# Patient Record
Sex: Female | Born: 1945 | Race: White | Hispanic: No | Marital: Married | Smoking: Former smoker
Health system: Southern US, Community
[De-identification: ages and names within clinical notes are randomized; demographics above are authoritative.]

## PROBLEM LIST (undated history)

## (undated) DIAGNOSIS — H903 Sensorineural hearing loss, bilateral: Secondary | ICD-10-CM

## (undated) DIAGNOSIS — R7303 Prediabetes: Secondary | ICD-10-CM

## (undated) DIAGNOSIS — I10 Essential (primary) hypertension: Secondary | ICD-10-CM

## (undated) DIAGNOSIS — E119 Type 2 diabetes mellitus without complications: Secondary | ICD-10-CM

## (undated) DIAGNOSIS — E7800 Pure hypercholesterolemia, unspecified: Secondary | ICD-10-CM

## (undated) HISTORY — PX: BREAST BIOPSY: SHX20

## (undated) HISTORY — PX: ABDOMINAL HYSTERECTOMY: SHX81

---

## 2005-11-16 ENCOUNTER — Ambulatory Visit: Payer: Self-pay | Admitting: Internal Medicine

## 2006-07-15 ENCOUNTER — Ambulatory Visit: Payer: Self-pay

## 2007-07-25 ENCOUNTER — Ambulatory Visit: Payer: Self-pay

## 2007-08-20 ENCOUNTER — Ambulatory Visit: Payer: Self-pay | Admitting: Internal Medicine

## 2014-01-23 ENCOUNTER — Ambulatory Visit: Payer: Self-pay | Admitting: Nurse Practitioner

## 2014-12-28 ENCOUNTER — Encounter
Admission: EM | Disposition: A | Discharge status: Home / Self Care | Payer: Self-pay | Source: Home / Self Care | Attending: Emergency Medicine

## 2014-12-28 ENCOUNTER — Observation Stay
Admission: EM | Admit: 2014-12-28 | Discharge: 2014-12-29 | Disposition: A | Discharge status: Home / Self Care | Payer: Medicare Other | Attending: General Surgery | Admitting: General Surgery

## 2014-12-28 ENCOUNTER — Ambulatory Visit (INDEPENDENT_AMBULATORY_CARE_PROVIDER_SITE_OTHER)
Admission: EM | Admit: 2014-12-28 | Discharge: 2014-12-28 | Disposition: A | Discharge status: Other Acute Inpatient Hospital | Payer: Medicare Other | Source: Home / Self Care | Attending: Family Medicine | Admitting: Family Medicine

## 2014-12-28 ENCOUNTER — Observation Stay: Payer: Medicare Other | Admitting: Anesthesiology

## 2014-12-28 ENCOUNTER — Encounter: Payer: Self-pay | Admitting: Emergency Medicine

## 2014-12-28 ENCOUNTER — Emergency Department: Payer: Medicare Other

## 2014-12-28 DIAGNOSIS — Z79899 Other long term (current) drug therapy: Secondary | ICD-10-CM | POA: Diagnosis not present

## 2014-12-28 DIAGNOSIS — L0291 Cutaneous abscess, unspecified: Secondary | ICD-10-CM | POA: Diagnosis not present

## 2014-12-28 DIAGNOSIS — N8110 Cystocele, unspecified: Secondary | ICD-10-CM | POA: Diagnosis not present

## 2014-12-28 DIAGNOSIS — I10 Essential (primary) hypertension: Secondary | ICD-10-CM | POA: Insufficient documentation

## 2014-12-28 DIAGNOSIS — E7800 Pure hypercholesterolemia, unspecified: Secondary | ICD-10-CM | POA: Diagnosis not present

## 2014-12-28 DIAGNOSIS — Z90710 Acquired absence of both cervix and uterus: Secondary | ICD-10-CM | POA: Diagnosis not present

## 2014-12-28 DIAGNOSIS — R509 Fever, unspecified: Secondary | ICD-10-CM | POA: Insufficient documentation

## 2014-12-28 DIAGNOSIS — Z87891 Personal history of nicotine dependence: Secondary | ICD-10-CM | POA: Insufficient documentation

## 2014-12-28 DIAGNOSIS — Z7984 Long term (current) use of oral hypoglycemic drugs: Secondary | ICD-10-CM | POA: Diagnosis not present

## 2014-12-28 DIAGNOSIS — L0390 Cellulitis, unspecified: Secondary | ICD-10-CM | POA: Diagnosis not present

## 2014-12-28 DIAGNOSIS — Z791 Long term (current) use of non-steroidal anti-inflammatories (NSAID): Secondary | ICD-10-CM | POA: Insufficient documentation

## 2014-12-28 DIAGNOSIS — E119 Type 2 diabetes mellitus without complications: Secondary | ICD-10-CM | POA: Insufficient documentation

## 2014-12-28 DIAGNOSIS — L02211 Cutaneous abscess of abdominal wall: Principal | ICD-10-CM | POA: Insufficient documentation

## 2014-12-28 HISTORY — PX: INCISION AND DRAINAGE ABSCESS: SHX5864

## 2014-12-28 HISTORY — DX: Pure hypercholesterolemia, unspecified: E78.00

## 2014-12-28 HISTORY — DX: Essential (primary) hypertension: I10

## 2014-12-28 HISTORY — DX: Type 2 diabetes mellitus without complications: E11.9

## 2014-12-28 LAB — CBC WITH DIFFERENTIAL/PLATELET
BASOS ABS: 0.1 10*3/uL (ref 0–0.1)
BASOS PCT: 1 %
Eosinophils Absolute: 0.2 10*3/uL (ref 0–0.7)
Eosinophils Relative: 1 %
HEMATOCRIT: 36.9 % (ref 35.0–47.0)
Hemoglobin: 12.5 g/dL (ref 12.0–16.0)
LYMPHS PCT: 12 %
Lymphs Abs: 2.2 10*3/uL (ref 1.0–3.6)
MCH: 29.4 pg (ref 26.0–34.0)
MCHC: 33.7 g/dL (ref 32.0–36.0)
MCV: 87.1 fL (ref 80.0–100.0)
Monocytes Absolute: 1.2 10*3/uL — ABNORMAL HIGH (ref 0.2–0.9)
Monocytes Relative: 7 %
NEUTROS ABS: 14.1 10*3/uL — AB (ref 1.4–6.5)
NEUTROS PCT: 79 %
Platelets: 242 10*3/uL (ref 150–440)
RBC: 4.24 MIL/uL (ref 3.80–5.20)
RDW: 12.6 % (ref 11.5–14.5)
WBC: 17.8 10*3/uL — AB (ref 3.6–11.0)

## 2014-12-28 LAB — URINALYSIS COMPLETE WITH MICROSCOPIC (ARMC ONLY)
BACTERIA UA: NONE SEEN
Bilirubin Urine: NEGATIVE
Glucose, UA: NEGATIVE mg/dL
Hgb urine dipstick: NEGATIVE
KETONES UR: NEGATIVE mg/dL
Nitrite: NEGATIVE
PH: 7 (ref 5.0–8.0)
PROTEIN: NEGATIVE mg/dL
RBC / HPF: NONE SEEN RBC/hpf (ref 0–5)
Specific Gravity, Urine: 1.02 (ref 1.005–1.030)

## 2014-12-28 LAB — COMPREHENSIVE METABOLIC PANEL
ALBUMIN: 3.6 g/dL (ref 3.5–5.0)
ALT: 20 U/L (ref 14–54)
AST: 24 U/L (ref 15–41)
Alkaline Phosphatase: 79 U/L (ref 38–126)
Anion gap: 8 (ref 5–15)
BILIRUBIN TOTAL: 0.7 mg/dL (ref 0.3–1.2)
BUN: 11 mg/dL (ref 6–20)
CHLORIDE: 100 mmol/L — AB (ref 101–111)
CO2: 29 mmol/L (ref 22–32)
CREATININE: 0.63 mg/dL (ref 0.44–1.00)
Calcium: 8.7 mg/dL — ABNORMAL LOW (ref 8.9–10.3)
GFR calc Af Amer: >60 mL/min (ref >60)
GLUCOSE: 124 mg/dL — AB (ref 65–99)
Potassium: 3.4 mmol/L — ABNORMAL LOW (ref 3.5–5.1)
Sodium: 137 mmol/L (ref 135–145)
TOTAL PROTEIN: 8.1 g/dL (ref 6.5–8.1)

## 2014-12-28 LAB — GLUCOSE, CAPILLARY: Glucose-Capillary: 92 mg/dL (ref 65–99)

## 2014-12-28 LAB — LIPASE, BLOOD: LIPASE: 27 U/L (ref 11–51)

## 2014-12-28 SURGERY — INCISION AND DRAINAGE, ABSCESS
Anesthesia: Choice | Site: Abdomen | Wound class: Dirty or Infected

## 2014-12-28 MED ORDER — LACTATED RINGERS IV SOLN
INTRAVENOUS | Status: DC | PRN
  Administered 2014-12-28: 22:00:00 via INTRAVENOUS

## 2014-12-28 MED ORDER — CEFTRIAXONE SODIUM 1 G IJ SOLR
1.0000 g | INTRAMUSCULAR | Status: AC
  Administered 2014-12-28: 1 g via INTRAVENOUS
  Filled 2014-12-28: qty 10

## 2014-12-28 MED ORDER — EPHEDRINE SULFATE 50 MG/ML IJ SOLN
INTRAMUSCULAR | Status: DC | PRN
  Administered 2014-12-28: 10 mg via INTRAVENOUS

## 2014-12-28 MED ORDER — IOHEXOL 240 MG/ML SOLN
25.0000 mL | Freq: Once | INTRAMUSCULAR | Status: AC | PRN
  Administered 2014-12-28: 25 mL via ORAL

## 2014-12-28 MED ORDER — DIPHENHYDRAMINE HCL 12.5 MG/5ML PO ELIX: 12.5000 mg | ORAL_SOLUTION | Freq: Four times a day (QID) | ORAL | Status: DC | PRN

## 2014-12-28 MED ORDER — LIDOCAINE HCL (PF) 1 % IJ SOLN
INTRAMUSCULAR | Status: AC
  Filled 2014-12-28: qty 30

## 2014-12-28 MED ORDER — FENTANYL CITRATE (PF) 100 MCG/2ML IJ SOLN: 25.0000 ug | INTRAMUSCULAR | Status: DC | PRN

## 2014-12-28 MED ORDER — MORPHINE SULFATE (PF) 4 MG/ML IV SOLN: 4.0000 mg | INTRAVENOUS | Status: DC | PRN

## 2014-12-28 MED ORDER — BUPIVACAINE HCL (PF) 0.5 % IJ SOLN
INTRAMUSCULAR | Status: AC
  Filled 2014-12-28: qty 30

## 2014-12-28 MED ORDER — HYDROCODONE-ACETAMINOPHEN 5-325 MG PO TABS: 1.0000 | ORAL_TABLET | ORAL | Status: DC | PRN

## 2014-12-28 MED ORDER — METFORMIN HCL 500 MG PO TABS
500.0000 mg | ORAL_TABLET | Freq: Every day | ORAL | Status: DC
  Administered 2014-12-29: 500 mg via ORAL
  Filled 2014-12-28: qty 1

## 2014-12-28 MED ORDER — ONDANSETRON HCL 4 MG/2ML IJ SOLN: 4.0000 mg | Freq: Four times a day (QID) | INTRAMUSCULAR | Status: DC | PRN

## 2014-12-28 MED ORDER — HYDROCHLOROTHIAZIDE 25 MG PO TABS
25.0000 mg | ORAL_TABLET | Freq: Every day | ORAL | Status: DC
  Administered 2014-12-29: 25 mg via ORAL
  Filled 2014-12-28: qty 1

## 2014-12-28 MED ORDER — ROCURONIUM BROMIDE 100 MG/10ML IV SOLN
INTRAVENOUS | Status: DC | PRN
  Administered 2014-12-28: 5 mg via INTRAVENOUS

## 2014-12-28 MED ORDER — SODIUM CHLORIDE 0.9 % IV BOLUS (SEPSIS)
500.0000 mL | INTRAVENOUS | Status: AC
  Administered 2014-12-28: 500 mL via INTRAVENOUS

## 2014-12-28 MED ORDER — DIPHENHYDRAMINE HCL 50 MG/ML IJ SOLN: 12.5000 mg | Freq: Four times a day (QID) | INTRAMUSCULAR | Status: DC | PRN

## 2014-12-28 MED ORDER — ONDANSETRON HCL 4 MG/2ML IJ SOLN: 4.0000 mg | Freq: Once | INTRAMUSCULAR | Status: DC | PRN

## 2014-12-28 MED ORDER — METOCLOPRAMIDE HCL 5 MG/ML IJ SOLN
INTRAMUSCULAR | Status: DC | PRN
  Administered 2014-12-28: 10 mg via INTRAVENOUS

## 2014-12-28 MED ORDER — ONDANSETRON HCL 4 MG/2ML IJ SOLN
INTRAMUSCULAR | Status: DC | PRN
  Administered 2014-12-28: 4 mg via INTRAVENOUS

## 2014-12-28 MED ORDER — FENTANYL CITRATE (PF) 100 MCG/2ML IJ SOLN
INTRAMUSCULAR | Status: DC | PRN
  Administered 2014-12-28 (×2): 50 ug via INTRAVENOUS

## 2014-12-28 MED ORDER — LACTATED RINGERS IV SOLN
INTRAVENOUS | Status: DC
  Administered 2014-12-29 (×2): via INTRAVENOUS

## 2014-12-28 MED ORDER — SUCCINYLCHOLINE CHLORIDE 20 MG/ML IJ SOLN
INTRAMUSCULAR | Status: DC | PRN
  Administered 2014-12-28: 120 mg via INTRAVENOUS

## 2014-12-28 MED ORDER — MIDAZOLAM HCL 2 MG/2ML IJ SOLN
INTRAMUSCULAR | Status: DC | PRN
  Administered 2014-12-28: 2 mg via INTRAVENOUS

## 2014-12-28 MED ORDER — BUPIVACAINE HCL (PF) 0.5 % IJ SOLN
INTRAMUSCULAR | Status: DC | PRN
  Administered 2014-12-28: 20 mL

## 2014-12-28 MED ORDER — IOHEXOL 300 MG/ML  SOLN
100.0000 mL | Freq: Once | INTRAMUSCULAR | Status: AC | PRN
  Administered 2014-12-28: 100 mL via INTRAVENOUS

## 2014-12-28 MED ORDER — ONDANSETRON 4 MG PO TBDP: 4.0000 mg | ORAL_TABLET | Freq: Four times a day (QID) | ORAL | Status: DC | PRN

## 2014-12-28 MED ORDER — HYDRALAZINE HCL 20 MG/ML IJ SOLN
10.0000 mg | INTRAMUSCULAR | Status: DC | PRN
Start: 2014-12-28 — End: 2014-12-29

## 2014-12-28 MED ORDER — PROPOFOL 10 MG/ML IV BOLUS
INTRAVENOUS | Status: DC | PRN
  Administered 2014-12-28: 150 mg via INTRAVENOUS

## 2014-12-28 MED ORDER — SULFAMETHOXAZOLE-TRIMETHOPRIM 800-160 MG PO TABS
2.0000 | ORAL_TABLET | Freq: Two times a day (BID) | ORAL | Status: DC
Start: 2014-12-28 — End: 2014-12-29
  Administered 2014-12-29 (×2): 2 via ORAL
  Filled 2014-12-28 (×2): qty 2

## 2014-12-28 MED ORDER — LIDOCAINE HCL (CARDIAC) 20 MG/ML IV SOLN
INTRAVENOUS | Status: DC | PRN
  Administered 2014-12-28: 30 mg via INTRAVENOUS

## 2014-12-28 SURGICAL SUPPLY — 31 items
CANISTER SUCT 1200ML W/VALVE (MISCELLANEOUS) ×3 IMPLANT
CATH TRAY 16F METER LATEX (MISCELLANEOUS) IMPLANT
CHLORAPREP W/TINT 26ML (MISCELLANEOUS) ×3 IMPLANT
DRAPE LAPAROTOMY 100X77 ABD (DRAPES) ×3 IMPLANT
DRAPE SHEET LG 3/4 BI-LAMINATE (DRAPES) ×3 IMPLANT
DRAPE UTILITY 15X26 TOWEL STRL (DRAPES) IMPLANT
GAUZE SPONGE 4X4 12PLY STRL (GAUZE/BANDAGES/DRESSINGS) ×3 IMPLANT
GLOVE BIO SURGEON STRL SZ7.5 (GLOVE) ×3 IMPLANT
GOWN STRL REUS W/ TWL LRG LVL3 (GOWN DISPOSABLE) ×2 IMPLANT
GOWN STRL REUS W/TWL LRG LVL3 (GOWN DISPOSABLE) ×4
KIT RM TURNOVER STRD PROC AR (KITS) ×3 IMPLANT
LABEL OR SOLS (LABEL) ×3 IMPLANT
NS IRRIG 1000ML POUR BTL (IV SOLUTION) ×3 IMPLANT
PACK BASIN MAJOR ARMC (MISCELLANEOUS) ×3 IMPLANT
PAD GROUND ADULT SPLIT (MISCELLANEOUS) ×3 IMPLANT
SET YANKAUER POOLE SUCT (MISCELLANEOUS) ×3 IMPLANT
SPONGE LAP 18X18 5 PK (GAUZE/BANDAGES/DRESSINGS) ×3 IMPLANT
STAPLER SKIN PROX 35W (STAPLE) IMPLANT
SUT PROLENE 0 CT 1 30 (SUTURE) IMPLANT
SUT SILK 2 0 (SUTURE)
SUT SILK 2-0 30XBRD TIE 12 (SUTURE) IMPLANT
SUT SILK 3-0 (SUTURE) ×3 IMPLANT
SUT VIC AB 2-0 BRD 54 (SUTURE) ×3 IMPLANT
SUT VIC AB 2-0 CT1 27 (SUTURE)
SUT VIC AB 2-0 CT1 TAPERPNT 27 (SUTURE) IMPLANT
SUT VIC AB 3-0 54X BRD REEL (SUTURE) IMPLANT
SUT VIC AB 3-0 BRD 54 (SUTURE)
SUT VIC AB 3-0 SH 27 (SUTURE)
SUT VIC AB 3-0 SH 27X BRD (SUTURE) IMPLANT
SYR BULB IRRIG 60ML STRL (SYRINGE) IMPLANT
WND VAC CANISTER 500ML (MISCELLANEOUS) IMPLANT

## 2014-12-28 NOTE — ED Notes (Signed)
Pt has had subjective fever and suprapubic/lower abdominal pain X 7 days that has been on and off.  Went to urgent care and they sent here for infection.

## 2014-12-28 NOTE — H&P (Signed)
Patient ID: Evelyn Kirby, female   DOB: 07-Nov-1945, 69 y.o.   MRN: [id number]  CC: ABSCESS  HPI Evelyn Pottersorma Johnson Westwood is a 69 y.o. female presents emergency department after being seen in urgent care secondary to a lower anterior abdominal infection. Patient states that over the last week she's had a gradual increase in discomfort followed by redness to the area. She's not noticed any drainage. The area of infection is near a prior hysterectomy incision and is very tender to touch. She's had some subjective fevers today which caused her to seek care. She denies any nausea, vomiting, diarrhea, constipation, chest pain, shortness of breath. Prior to this she was in her usual state with her chronic medical problems.  HPI  Past Medical History  Diagnosis Date  . Hypertension   . Hypercholesteremia   . Diabetes mellitus without complication [facility](HCC)     Past Surgical History  Procedure Laterality Date  . Abdominal hysterectomy      Family History  Problem Relation Age of Onset  . Heart failure Mother     Social History Social History  Substance Use Topics  . Smoking status: Former Games developermoker  . Smokeless tobacco: None  . Alcohol Use: No    No Known Allergies  No current facility-administered medications for this encounter.   Current Outpatient Prescriptions  Medication Sig Dispense Refill  . hydrochlorothiazide (HYDRODIURIL) 25 MG tablet Take 1 tablet by mouth daily.    . metFORMIN (GLUCOPHAGE) 500 MG tablet Take 1 tablet by mouth daily.    . naproxen sodium (RA NAPROXEN SODIUM) 220 MG tablet Take 2 tablets by mouth daily.    . simvastatin (ZOCOR) 40 MG tablet Take 1 tablet by mouth at bedtime.       Review of Systems A multi-point review of systems was asked and was negative except for the findings documented in the history of present illness  Physical Exam Blood pressure 144/72, pulse 90, temperature 98.7 F (37.1 C), temperature source Oral, resp. rate 16, height 5'  1.5" (1.562 m), weight 61.236 kg (135 lb), SpO2 97 %. CONSTITUTIONAL: No acute distress. EYES: Pupils are equal, round, and reactive to light, Sclera are non-icteric. EARS, NOSE, MOUTH AND THROAT: The oropharynx is clear. The oral mucosa is pink and moist. Hearing is intact to voice. LYMPH NODES:  Lymph nodes in the neck are normal. RESPIRATORY:  Lungs are clear. There is normal respiratory effort, with equal breath sounds bilaterally, and without pathologic use of accessory muscles. CARDIOVASCULAR: Heart is regular without murmurs, gallops, or rubs. GI: The abdomen is soft, tender to palpation in the lower midline at the area of redness, and nondistended. In her lower midline there is a visible erythema and cellulitis coming from her lower abdominal fold, there is a palpable area of induration that is very tender consistent with abscess There are no palpable masses. There is no hepatosplenomegaly. There are normal bowel sounds in all quadrants. GU: Rectal deferred.   MUSCULOSKELETAL: Normal muscle strength and tone. No cyanosis or edema.   SKIN: Turgor is good and there are no pathologic skin lesions or ulcers. NEUROLOGIC: Motor and sensation is grossly normal. Cranial nerves are grossly intact. PSYCH:  Oriented to person, place and time. Affect is normal.  Data Reviewed Labs concerning for leukocytosis of 17.8. CT shows an abscess that correlates with the palpable area of induration. It is approximately 5 cm in greatest diameter and is all superficial to the fascia. There is no intra-abdominal pathology visualized that correlates  with her infection. I have personally reviewed the patient's imaging, laboratory findings and medical records.    Assessment    Abdominal wall abscess    Plan    Discussed with the patient and her family the diagnosis of her abdominal wall abscess. Discussed with patient that her diabetes makes her prone to possibility of more competent infectious. Due to the size  and location of this abscess discussed a trip to the operating room to drain her infection. The procedure was described in detail to include the risks, benefits, alternatives. Primary risks being pain, bleeding, infection and possible need for more drainage. Patient voiced understanding and we will proceed urgently to the operating room for incision and drainage of her abdominal wall abscess. She'll be right at the hospital under observation afterwards.     Time spent with the patient was 45 minutes, with more than 50% of the time spent in face-to-face education, counseling and care coordination.     Ricarda Frame, MD FACS General Surgeon 12/28/2014, 9:21 PM

## 2014-12-28 NOTE — ED Notes (Signed)
Dr. Allena KatzPatel called report to charge nurse at [facility] ED.

## 2014-12-28 NOTE — ED Notes (Signed)
Pt presents with lower abdominal tenderness and redness/heat to skin.

## 2014-12-28 NOTE — Anesthesia Postprocedure Evaluation (Signed)
Anesthesia Post Note  Patient: Evelyn Kirby  Procedure(s) Performed: Procedure(s) (LRB): INCISION AND DRAINAGE ABSCESS (N/A)  Patient location during evaluation: PACU Anesthesia Type: General Level of consciousness: awake and alert and oriented Pain management: pain level controlled Vital Signs Assessment: post-procedure vital signs reviewed and stable Respiratory status: spontaneous breathing Cardiovascular status: blood pressure returned to baseline Postop Assessment: no headache Anesthetic complications: no    Last Vitals:  Filed Vitals:   12/28/14 2253 12/28/14 2320  BP:    Pulse:    Temp: 37.8 C 37.4 C  Resp: 21     Last Pain:  Filed Vitals:   12/28/14 2324  PainSc: 0-No pain                 Aspen Lawrance

## 2014-12-28 NOTE — Op Note (Signed)
  12/28/2014  10:53 PM  PATIENT:  Gavin PottersNorma Johnson Millikin  69 y.o. female  PRE-OPERATIVE DIAGNOSIS:  Abdominal wall abscess  POST-OPERATIVE DIAGNOSIS:  Abdominal wall abscess  PROCEDURE:  Incision and drainage of abdominal wall abscess  SURGEON:  Surgeon(s) and Role:    * Ricarda Frameharles Dewitte Vannice, MD - Primary  ASSISTANTS: None  ANESTHESIA: Gen.  INDICATIONS FOR PROCEDURE worsening abdominal wall abscess  DICTATION: Patient was identified in the emergency department is having an anterior abdominal wall abscess below her umbilicus but above her prior Pfannenstiel incision. She confirmed the need for incision and drainage of this abdominal wall abscess and informed consent was confirmed. Patient and family voiced understanding of the risks of drainage and wished to proceed.  Procedure in detail, patient taken to the operating room placed in supine position where general endotracheal anesthesia was obtained. After an uneventful induction and intubation patient was prepped and draped in standard sterile fashion. We then performed timeout that correctly identified the patient and procedure and safety precautions. We then proceeded with an incision and drainage of abdominal wall abscess.  A linear incision was made over the greatest area of induration. Immediate return of grossly purulent fluid was obtained. This fluid was then cultured. Dissection was then used to remove the remainder of the freely flowing fluid. Scissors were used to make the lower opening wider and a Kelly forceps was used to bluntly opened up the abscess cavity. The entire cavity was then copiously irrigated with saline irrigation. There is no continued purulent drainage after irrigation. Of note, a free-floating Maxon suture was found in the middle the abscess.  Due to the size of the abscess cavity decision was made to place a Penrose drain with an. Quarter-inch Penrose drain was placed into the abscess cavity and secured with a 3-0  nylon suture. The lateral edge of the incision was also reapproximated with 3-0 nylon suture. Entire area was localized with a 0.5% Marcaine plain. A sterile dressing of plain gauze and ABDs pad was placed over this and secured with tape.  All counts were correct at the end of the procedure. There were no immediate complications. Patient was awoken from general endotracheal anesthesia and transferred to the PACU in good condition.  Findings: Large abdominal wall abscess  Complications: None  Estimated Blood loss: 5 miles  Drains: Reesa ChewQuarter-inch Penrose   Haidynn Almendarez, MD  12/28/2014

## 2014-12-28 NOTE — ED Notes (Signed)
States started last week with rash and itching around surgical scar from hysterectomy 30+ years ago. Now occasionally painful, red. States "has had same before and needed oral antibiotics"

## 2014-12-28 NOTE — ED Notes (Signed)
Pt reports has had infection before in stomach and it burst.  Unable to name infection or what dx was.

## 2014-12-28 NOTE — ED Notes (Signed)
MD at bedside. 

## 2014-12-28 NOTE — Brief Op Note (Signed)
12/28/2014  10:51 PM  PATIENT:  Gavin PottersNorma Johnson Delancey  69 y.o. female  PRE-OPERATIVE DIAGNOSIS:  abdominal abscess  POST-OPERATIVE DIAGNOSIS:  abdominal wall abscess  PROCEDURE:  Procedure(s): INCISION AND DRAINAGE ABSCESS (N/A)  SURGEON:  Surgeon(s) and Role:    * Ricarda Frameharles Karesa Maultsby, MD - Primary  PHYSICIAN ASSISTANT:   ASSISTANTS: none   ANESTHESIA:   general  EBL:     BLOOD ADMINISTERED:none  DRAINS: Penrose drain in the abscess cavity   LOCAL MEDICATIONS USED:  MARCAINE     SPECIMEN:  Source of Specimen:  culture of drainage  DISPOSITION OF SPECIMEN:  PATHOLOGY  COUNTS:  YES  TOURNIQUET:  * No tourniquets in log *  DICTATION: .Dragon Dictation  PLAN OF CARE: Admit to inpatient   PATIENT DISPOSITION:  PACU - hemodynamically stable.   Delay start of Pharmacological VTE agent (>24hrs) due to surgical blood loss or risk of bleeding: no

## 2014-12-28 NOTE — Anesthesia Preprocedure Evaluation (Signed)
Anesthesia Evaluation  Patient identified by MRN, date of birth, ID band Patient awake    Reviewed: Allergy & Precautions, NPO status , Patient's Chart, lab work & pertinent test results  Airway Mallampati: III  TM Distance: <3 FB Neck ROM: Full    Dental  (+) Caps Front upper bridge:   Pulmonary former smoker,    Pulmonary exam normal breath sounds clear to auscultation       Cardiovascular hypertension, Pt. on medications Normal cardiovascular exam     Neuro/Psych negative neurological ROS  negative psych ROS   GI/Hepatic Neg liver ROS, Abdominal wall abcess   Endo/Other  diabetes, Well Controlled, Type 2, Oral Hypoglycemic Agents  Renal/GU negative Renal ROS  negative genitourinary   Musculoskeletal negative musculoskeletal ROS (+)   Abdominal (+)  Abdomen: tender.    Peds negative pediatric ROS (+)  Hematology   Anesthesia Other Findings   Reproductive/Obstetrics negative OB ROS                             Anesthesia Physical Anesthesia Plan  ASA: II and emergent  Anesthesia Plan: General   Post-op Pain Management:    Induction: Intravenous, Cricoid pressure planned and Rapid sequence  Airway Management Planned: Oral ETT  Additional Equipment:   Intra-op Plan:   Post-operative Plan: Extubation in OR  Informed Consent: I have reviewed the patients History and Physical, chart, labs and discussed the procedure including the risks, benefits and alternatives for the proposed anesthesia with the patient or authorized representative who has indicated his/her understanding and acceptance.   Dental advisory given  Plan Discussed with: CRNA and Surgeon  Anesthesia Plan Comments:         Anesthesia Quick Evaluation

## 2014-12-28 NOTE — Transfer of Care (Signed)
Immediate Anesthesia Transfer of Care Note  Patient: Evelyn Kirby  Procedure(s) Performed: Procedure(s): INCISION AND DRAINAGE ABSCESS (N/A)  Patient Location: PACU  Anesthesia Type:General  Level of Consciousness: awake, alert  and oriented  Airway & Oxygen Therapy: Patient Spontanous Breathing and Patient connected to face mask oxygen  Post-op Assessment: Report given to RN and Post -op Vital signs reviewed and stable  Post vital signs: Reviewed and stable  Last Vitals:  Filed Vitals:   12/28/14 2100 12/28/14 2138  BP:  158/63  Pulse: 90 99  Temp:    Resp:      Complications: No apparent anesthesia complications

## 2014-12-28 NOTE — ED Provider Notes (Signed)
[facility] Emergency Department Provider Note  ____________________________________________  Time seen: Approximately 7:12 PM  I have reviewed the triage vital signs and the nursing notes.   HISTORY  Chief Complaint Abdominal Pain    HPI Evelyn Kirby is a 69 y.o. female with a history of hypertension, hypercholesterolemia, diabetes, and a abdominal hysterectomy about 30 years ago who presents with approximately one week of gradual onset of lower abdominal pain, redness near the site of her prior hysterectomy incision, and swelling and a firmness to palpation in the area.  She went to the urgent care today and was sent to the emergency department for further evaluation.She describes her pain as moderate although she is in no acute distress at this time.  She has had some subjective fevers and chills.  She denies chest pain, shortness of breath, nausea/vomiting, dysuria.   Past Medical History  Diagnosis Date  . Hypertension   . Hypercholesteremia   . Diabetes mellitus without complication [facility](HCC)     Patient Active Problem List   Diagnosis Date Noted  . Abscess 12/28/2014    Past Surgical History  Procedure Laterality Date  . Abdominal hysterectomy      No current outpatient prescriptions on file.  Allergies Review of patient's allergies indicates no known allergies.  Family History  Problem Relation Age of Onset  . Heart failure Mother     Social History Social History  Substance Use Topics  . Smoking status: Former Games developermoker  . Smokeless tobacco: None  . Alcohol Use: No    Review of Systems Constitutional: No fever/chills Eyes: No visual changes. ENT: No sore throat. Cardiovascular: Denies chest pain. Respiratory: Denies shortness of breath. Gastrointestinal: No abdominal pain.  No nausea, no vomiting.  No diarrhea.  No constipation. Genitourinary: Negative for dysuria. Musculoskeletal: Negative for back pain. Skin: lower  abdominal / pelvic redness Neurological: Negative for headaches, focal weakness or numbness.  10-point ROS otherwise negative.  ____________________________________________   PHYSICAL EXAM:  VITAL SIGNS: ED Triage Vitals  Enc Vitals Group     BP 12/28/14 1835 175/72 mmHg     Pulse Rate 12/28/14 1835 97     Resp 12/28/14 1835 16     Temp 12/28/14 1835 98.7 F (37.1 C)     Temp Source 12/28/14 1835 Oral     SpO2 12/28/14 1835 97 %     Weight 12/28/14 1835 135 lb (61.236 kg)     Height 12/28/14 1835 5' 1.5" (1.562 m)     Head Cir --      Peak Flow --      Pain Score 12/28/14 1836 2     Pain Loc --      Pain Edu? --      Excl. in GC? --     Constitutional: Alert and oriented. Well appearing and in no acute distress. Eyes: Conjunctivae are normal. PERRL. EOMI. Head: Atraumatic. Nose: No congestion/rhinnorhea. Mouth/Throat: Mucous membranes are moist.  Oropharynx non-erythematous. Neck: No stridor.   Cardiovascular: Normal rate, regular rhythm. Grossly normal heart sounds.  Good peripheral circulation. Respiratory: Normal respiratory effort.  No retractions. Lungs CTAB. Gastrointestinal: Abdomen in general is nontender to palpation and soft.  She has a large area of erythema consistent with cellulitis covering her anterior pannus and surrounding the hysterectomy scar.  In addition, there is a large area of induration surrounding the scar which the patient says is new and it is tender to palpation.. Musculoskeletal: No lower extremity tenderness nor edema.  No joint effusions. Neurologic:  Normal speech and language. No gross focal neurologic deficits are appreciated.  Skin:  Skin is warm, dry and intact. No rash noted. Psychiatric: Mood and affect are normal. Speech and behavior are normal.  ____________________________________________   LABS (all labs ordered are listed, but only abnormal results are displayed)  Labs Reviewed  CBC WITH DIFFERENTIAL/PLATELET - Abnormal;  Notable for the following:    WBC 17.8 (*)    Neutro Abs 14.1 (*)    Monocytes Absolute 1.2 (*)    All other components within normal limits  COMPREHENSIVE METABOLIC PANEL - Abnormal; Notable for the following:    Potassium 3.4 (*)    Chloride 100 (*)    Glucose, Bld 124 (*)    Calcium 8.7 (*)    All other components within normal limits  URINALYSIS COMPLETEWITH MICROSCOPIC ([facility] ONLY) - Abnormal; Notable for the following:    Color, Urine COLORLESS (*)    APPearance CLEAR (*)    Leukocytes, UA 2+ (*)    Squamous Epithelial / LPF 0-5 (*)    All other components within normal limits  CULTURE, BLOOD (ROUTINE X 2)  CULTURE, BLOOD (ROUTINE X 2)  LIPASE, BLOOD   ____________________________________________  EKG  Not indicated ____________________________________________  RADIOLOGY   Ct Abdomen Pelvis W Contrast  12/28/2014  CLINICAL DATA:  Patient presents today with symptoms of a red, warm, tender area along her surgical scar from her hysterectomy over 30 years ago. Patient states that she has noticed the area over the last 3 days getting more tender and larger. EXAM: CT ABDOMEN AND PELVIS WITH CONTRAST TECHNIQUE: Multidetector CT imaging of the abdomen and pelvis was performed using the standard protocol following bolus administration of intravenous contrast. CONTRAST:  OMNIPAQUE IOHEXOL 300 MG/ML  SOLN COMPARISON:  None available FINDINGS: Minimal dependent atelectasis posteriorly in the visualized lung bases. Unremarkable liver, nondilated gallbladder, spleen, adrenal glands, pancreas. 4.3 cm near fluid attenuation partially exophytic lesion from the upper pole right kidney, with some in tunnel enhancing septations in its inferior aspect. Kidneys otherwise unremarkable. No hydronephrosis. Stomach, small bowel, colon nondilated. Normal appendix. Portal vein patent. Urinary bladder physiologically distended. Large Cystocele. Complex process in the left anterior abdominal wall  inferior to the umbilicus with a peripherally enhancing somewhat irregular fluid collection in the deep subcutaneous tissues adjacent to the abdominal wall fascia , tracking superficially to the skin, measuring 5 cm anterior posterior x 3.4 cm craniocaudal x 3.5 cm. There are moderate inflammatory/ edematous changes around this process and extending laterally in the deep subcutaneous tissues of the body wall on the left. No intraperitoneal extension of the process is evident. No ascites. No free air. No adenopathy. 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 IMPRESSION: 1. 5 cm subcutaneous abscess in the low anterior body wall, with a dominant deep component tracking superficially to the skin surface. 2. 4.3 cm Bosniak category 2 F right upper pole renal cystic lesion. Recommend elective outpatient renal MR with contrast for further characterization. This recommendation follows ACR consensus guidelines: Managing Incidental Findings on Abdominal CT: White Paper of the ACR Incidental Findings Committee. J Am Coll Radiol 2010;7:754-773 . 3. Cystocele 4. 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 Electronically Signed   By: Corlis Leak M.D.   On: 12/28/2014 20:04    ____________________________________________   PROCEDURES  Procedure(s) performed: None  Critical Care performed: No ____________________________________________   INITIAL IMPRESSION / ASSESSMENT AND PLAN / ED COURSE  Pertinent labs &  imaging results that were available during my care of the patient were reviewed by me and considered in my medical decision making (see chart for details).  The patient has a leukocytosis of nearly 18 and has a large area of induration and what appears to be cellulitis of her lower abdomen.  However she is afebrile and has borderline tachycardia.  I will obtain a CT scan to evaluate for possible intra-abdominal abscess as well as giving a dose of empiric antibiotics given the  leukocytosis and probability that this is at least a cutaneous infection.  The patient is very stable at this time and understands and agrees with the plan.   (Note that documentation was delayed due to multiple ED patients requiring immediate care.)   The patient has a large abscess in the abdominal wall.  Given how deep it is in the size of it, I contacted the surgeon, Dr. Tonita Cong, who came to the emergency department and personally evaluated the patient as well as discussed it with me.  He is admitting her to take her to the operating room for drainage.  He agreed with my selection of ceftriaxone 1 g IV empiric antibiotics.  ____________________________________________  FINAL CLINICAL IMPRESSION(S) / ED DIAGNOSES  Final diagnoses:  Cutaneous abscess of abdominal wall      NEW MEDICATIONS STARTED DURING THIS VISIT:  Current Discharge Medication List       Loleta Rose, MD 12/28/14 2219

## 2014-12-28 NOTE — ED Notes (Signed)
Pts jewelery (watch, earrings, 3 rings) and put in bag w/ pt name.  Pts belongings given to husband, pt informed of this.

## 2014-12-28 NOTE — ED Provider Notes (Signed)
CSN: [id number]     Arrival date & time 12/28/14  1603 History   First MD Initiated Contact with Patient 12/28/14 1700     Chief Complaint  Patient presents with  . Rash   (Consider location/radiation/quality/duration/timing/severity/associated sxs/prior Treatment) HPI: Patient presents today with symptoms of a red, warm, tender area along her surgical scar from her hysterectomy over 30 years ago. Patient states that she has noticed the area over the last 3 days getting more tender and larger. She has had a low-grade fever at home. She denies any nausea or vomiting or chest pain or shortness of breath. She denies any diarrhea or constipation or any urinary symptoms.  Past Medical History  Diagnosis Date  . Hypertension   . Hypercholesteremia   . Diabetes mellitus without complication [facility](HCC)    Past Surgical History  Procedure Laterality Date  . Abdominal hysterectomy     Family History  Problem Relation Age of Onset  . Heart failure Mother    Social History  Substance Use Topics  . Smoking status: Former Games developermoker  . Smokeless tobacco: None  . Alcohol Use: No   OB History    No data available     Review of Systems: Negative except mentioned above.   Allergies  Review of patient's allergies indicates no known allergies.  Home Medications   Prior to Admission medications   Not on File   Meds Ordered and Administered this Visit  Medications - No data to display  BP 169/60 mmHg  Pulse 92  Temp(Src) 99.2 F (37.3 C) (Tympanic)  Resp 16  Ht 5' 1.5" (1.562 m)  Wt 135 lb (61.236 kg)  BMI 25.10 kg/m2  SpO2 99% No data found.   Physical Exam   GENERAL: NAD, doesn't appear toxic RESP: CTA B CARD: RRR ABD: large area of erythema, warmth, and induration along pannus, no fluctuance, mild to moderate tenderness of the area, no guarding or rebound  NEURO: CN II-XII grossly intact  ED Course  Procedures    MDM   1. Cellulitis and abscess   Given the location and  size of the area I would recommend that the patient go to the ER at this time to have possibly a CT done of the abdomen and pelvis and also IV/IM antibiotics. Patient may need admission. This was explained to the patient. Patient states that [facility]. I discussed the case with Judeth CornfieldStephanie at the ER who states she will tell the charge nurse who will be watching for the patient to come and be evaluated and treated promptly.    Jolene ProvostKirtida Ryver Zadrozny, MD 12/28/14 [phone]

## 2014-12-28 NOTE — Anesthesia Procedure Notes (Signed)
Procedure Name: Intubation Date/Time: 12/28/2014 10:12 PM Performed by: Tonia GhentOOK-MARTIN, Crissie Aloi Pre-anesthesia Checklist: Patient identified, Emergency Drugs available, Suction available, Patient being monitored and Timeout performed Patient Re-evaluated:Patient Re-evaluated prior to inductionOxygen Delivery Method: Circle system utilized Preoxygenation: Pre-oxygenation with 100% oxygen Intubation Type: IV induction, Rapid sequence and Cricoid Pressure applied Ventilation: Mask ventilation without difficulty Laryngoscope Size: Miller and 3 Grade View: Grade II Tube type: Oral Tube size: 7.0 mm Number of attempts: 1 Airway Equipment and Method: Stylet

## 2014-12-29 LAB — CBC
HEMATOCRIT: 32.3 % — AB (ref 35.0–47.0)
HEMOGLOBIN: 10.8 g/dL — AB (ref 12.0–16.0)
MCH: 28.7 pg (ref 26.0–34.0)
MCHC: 33.4 g/dL (ref 32.0–36.0)
MCV: 86 fL (ref 80.0–100.0)
Platelets: 203 10*3/uL (ref 150–440)
RBC: 3.75 MIL/uL — AB (ref 3.80–5.20)
RDW: 13 % (ref 11.5–14.5)
WBC: 12 10*3/uL — ABNORMAL HIGH (ref 3.6–11.0)

## 2014-12-29 MED ORDER — SULFAMETHOXAZOLE-TRIMETHOPRIM 800-160 MG PO TABS: 2.0000 | ORAL_TABLET | Freq: Two times a day (BID) | ORAL | Status: DC

## 2014-12-29 MED ORDER — HYDROCODONE-ACETAMINOPHEN 5-325 MG PO TABS: 1.0000 | ORAL_TABLET | ORAL | Status: DC | PRN

## 2014-12-29 NOTE — Care Management Obs Status (Signed)
MEDICARE OBSERVATION STATUS NOTIFICATION   Patient Details  Name: Evelyn Kirby MRN: [id number] Date of Birth: 05/30/1945   Medicare Observation Status Notification Given:  No (in house less than 24 hours)    Caren MacadamMichelle Jasey Cortez, RN 12/29/2014, 4:46 PM

## 2014-12-29 NOTE — Final Progress Note (Signed)
Surgery  Dressing intact Pain improved Stable and improved for discharge

## 2014-12-29 NOTE — Discharge Instructions (Signed)
Incision and Drainage Incision and drainage is a procedure in which a sac-like structure (cystic structure) is opened and drained. The area to be drained usually contains material such as pus, fluid, or blood.  LET YOUR CAREGIVER KNOW ABOUT:   Allergies to medicine.  Medicines taken, including vitamins, herbs, eyedrops, over-the-counter medicines, and creams.  Use of steroids (by mouth or creams).  Previous problems with anesthetics or numbing medicines.  History of bleeding problems or blood clots.  Previous surgery.  Other health problems, including diabetes and kidney problems.  Possibility of pregnancy, if this applies. RISKS AND COMPLICATIONS  Pain.  Bleeding.  Scarring.  Infection. BEFORE THE PROCEDURE  You may need to have an ultrasound or other imaging tests to see how large or deep your cystic structure is. Blood tests may also be used to determine if you have an infection or how severe the infection is. You may need to have a tetanus shot. PROCEDURE  The affected area is cleaned with a cleaning fluid. The cyst area will then be numbed with a medicine (local anesthetic). A small incision will be made in the cystic structure. A syringe or catheter may be used to drain the contents of the cystic structure, or the contents may be squeezed out. The area will then be flushed with a cleansing solution. After cleansing the area, it is often gently packed with a gauze or another wound dressing. Once it is packed, it will be covered with gauze and tape or some other type of wound dressing. AFTER THE PROCEDURE   Often, you will be allowed to go home right after the procedure.  You may be given antibiotic medicine to prevent or heal an infection.  If the area was packed with gauze or some other wound dressing, you will likely need to come back in 1 to 2 days to get it removed.  The area should heal in about 14 days.   This information is not intended to replace advice given  to you by your health care provider. Make sure you discuss any questions you have with your health care provider.   Document Released: 06/17/2000 Document Revised: 06/23/2011 Document Reviewed: 02/16/2011 Elsevier Interactive Patient Education 2016 [employer] Inc.  

## 2014-12-29 NOTE — Progress Notes (Signed)
Pt stable. IV removed. D/c instructions given and education provided. Drsg supplies given. Signed prescriptions given. Pt states she understands d/c instructions. Pt dressed and escorted out and driven home by family.

## 2014-12-29 NOTE — Discharge Summary (Signed)
Patient ID: Evelyn Kirby MRN: [id number] DOB/AGE: 13-Mar-1945 69 y.o.  Admit date: 12/28/2014 Discharge date: 12/29/2014  Discharge Diagnoses:  Abdominal Wall Abscess   Procedures Performed: Incision and Drainage of Abdominal wall abscess  Discharged Condition: good  Hospital Course: Taken to OR from ED with a large, superficial abdominal wall abscess. Tolerated procedure well. Able to be discharged home on oral antibiotics POD#1.  Discharge Orders: Discharge home, keep incision clean and dry, DO NOT SUBMERGE for 10 days.  Disposition: 02-Short Term Hospital  Discharge Medications:  .  HYDROcodone-acetaminophen (NORCO/VICODIN) 5-325 MG per tablet 1-2 tablet, 1-2 tablet, Oral, Q4H PRN, Ricarda Frameharles Aubreyana Saltz, MD .  sulfamethoxazole-trimethoprim (BACTRIM DS,SEPTRA DS) 800-160 MG per tablet 2 tablet, 2 tablet, Oral, Q12H, Ricarda Frameharles Tamina Cyphers, MD, 2 tablet at 12/29/14 0013  Follwup: Follow-up Information    [facility] [place]. Schedule an appointment as soon as possible for a visit in 2 weeks.   Specialty:  General Surgery   Why:  for wound check and drain removal   Contact information:   [address] [place] [id number] [phone]      Signed: Ricarda FrameCharles Shalee Paolo 12/29/2014, 6:36 AM

## 2014-12-31 ENCOUNTER — Encounter: Payer: Self-pay | Admitting: General Surgery

## 2015-01-01 LAB — WOUND CULTURE

## 2015-01-02 LAB — CULTURE, BLOOD (ROUTINE X 2)
Culture: NO GROWTH
Culture: NO GROWTH

## 2015-01-08 DIAGNOSIS — I10 Essential (primary) hypertension: Secondary | ICD-10-CM | POA: Insufficient documentation

## 2015-01-08 DIAGNOSIS — M1990 Unspecified osteoarthritis, unspecified site: Secondary | ICD-10-CM | POA: Insufficient documentation

## 2015-01-08 DIAGNOSIS — L309 Dermatitis, unspecified: Secondary | ICD-10-CM | POA: Insufficient documentation

## 2015-01-08 DIAGNOSIS — E538 Deficiency of other specified B group vitamins: Secondary | ICD-10-CM | POA: Insufficient documentation

## 2015-01-08 DIAGNOSIS — E119 Type 2 diabetes mellitus without complications: Secondary | ICD-10-CM | POA: Insufficient documentation

## 2015-01-08 DIAGNOSIS — E785 Hyperlipidemia, unspecified: Secondary | ICD-10-CM | POA: Insufficient documentation

## 2015-01-08 DIAGNOSIS — F32A Depression, unspecified: Secondary | ICD-10-CM | POA: Insufficient documentation

## 2015-01-08 DIAGNOSIS — F329 Major depressive disorder, single episode, unspecified: Secondary | ICD-10-CM | POA: Insufficient documentation

## 2015-01-11 ENCOUNTER — Encounter: Payer: Self-pay | Admitting: Surgery

## 2015-01-11 ENCOUNTER — Ambulatory Visit (INDEPENDENT_AMBULATORY_CARE_PROVIDER_SITE_OTHER): Payer: Medicare Other | Admitting: Surgery

## 2015-01-11 VITALS — BP 171/75 | HR 93 | Temp 97.7°F | Ht 62.0 in | Wt 138.0 lb

## 2015-01-11 DIAGNOSIS — L0291 Cutaneous abscess, unspecified: Secondary | ICD-10-CM

## 2015-01-11 NOTE — Progress Notes (Signed)
[age]yr old female with abdominal abscess, s/p I &D with penrose drain.  She denies fever, chills, redness and some drainage.    Filed Vitals:   01/11/15 0831  BP: 171/75  Pulse: 93  Temp: 97.7 F (36.5 C)   PE:  Gen: NAD Abd: penrose removed, no erythema, some drainage   A/p:  Healing well, finish out Bactrim, 1 day left.  If fever, redness or pain come back contact us, otherwise is should resolve over the next week or two.

## 2015-01-11 NOTE — Patient Instructions (Signed)
We have taken your drain out today. This will continue to drain for the next few days. You will want to keep a dressing over this area until this draining has stopped.  Please call our office with any questions or concerns.  Please do not submerge in a tub, hot tub, or pool until incisions are completely sealed.  If you develop redness, drainage, or pain at drain site- call our office immediately and speak with a nurse.

## 2015-01-17 ENCOUNTER — Other Ambulatory Visit: Payer: Self-pay | Admitting: Nurse Practitioner

## 2015-01-17 DIAGNOSIS — Z1231 Encounter for screening mammogram for malignant neoplasm of breast: Secondary | ICD-10-CM

## 2015-01-25 ENCOUNTER — Ambulatory Visit
Admission: RE | Admit: 2015-01-25 | Discharge: 2015-01-25 | Disposition: A | Discharge status: Home / Self Care | Payer: Medicare Other | Source: Ambulatory Visit | Attending: Nurse Practitioner | Admitting: Nurse Practitioner

## 2015-01-25 ENCOUNTER — Ambulatory Visit: Payer: Medicare Other

## 2015-01-25 DIAGNOSIS — Z1231 Encounter for screening mammogram for malignant neoplasm of breast: Secondary | ICD-10-CM | POA: Diagnosis not present

## 2015-02-17 DIAGNOSIS — L02211 Cutaneous abscess of abdominal wall: Secondary | ICD-10-CM | POA: Insufficient documentation

## 2016-02-10 ENCOUNTER — Other Ambulatory Visit: Payer: Self-pay | Admitting: Nurse Practitioner

## 2016-02-10 DIAGNOSIS — Z1231 Encounter for screening mammogram for malignant neoplasm of breast: Secondary | ICD-10-CM

## 2016-02-12 ENCOUNTER — Other Ambulatory Visit: Payer: Self-pay | Admitting: Nurse Practitioner

## 2016-02-12 ENCOUNTER — Ambulatory Visit
Admission: RE | Admit: 2016-02-12 | Discharge: 2016-02-12 | Disposition: A | Discharge status: Home / Self Care | Payer: Medicare Other | Source: Ambulatory Visit | Attending: Nurse Practitioner | Admitting: Nurse Practitioner

## 2016-02-12 DIAGNOSIS — Z1231 Encounter for screening mammogram for malignant neoplasm of breast: Secondary | ICD-10-CM

## 2017-02-11 ENCOUNTER — Other Ambulatory Visit: Payer: Self-pay | Admitting: Nurse Practitioner

## 2017-02-11 DIAGNOSIS — Z1231 Encounter for screening mammogram for malignant neoplasm of breast: Secondary | ICD-10-CM

## 2017-02-16 ENCOUNTER — Ambulatory Visit
Admission: RE | Admit: 2017-02-16 | Discharge: 2017-02-16 | Disposition: A | Discharge status: Home / Self Care | Payer: Medicare Other | Source: Ambulatory Visit | Attending: Nurse Practitioner | Admitting: Nurse Practitioner

## 2017-02-16 DIAGNOSIS — Z1231 Encounter for screening mammogram for malignant neoplasm of breast: Secondary | ICD-10-CM | POA: Diagnosis not present

## 2018-02-22 ENCOUNTER — Other Ambulatory Visit: Payer: Self-pay | Admitting: Nurse Practitioner

## 2018-02-22 DIAGNOSIS — Z1231 Encounter for screening mammogram for malignant neoplasm of breast: Secondary | ICD-10-CM

## 2018-03-07 ENCOUNTER — Other Ambulatory Visit: Payer: Self-pay

## 2018-03-07 ENCOUNTER — Ambulatory Visit
Admission: RE | Admit: 2018-03-07 | Discharge: 2018-03-07 | Disposition: A | Discharge status: Home / Self Care | Payer: Medicare Other | Source: Ambulatory Visit | Attending: Nurse Practitioner | Admitting: Nurse Practitioner

## 2018-03-07 DIAGNOSIS — Z1231 Encounter for screening mammogram for malignant neoplasm of breast: Secondary | ICD-10-CM | POA: Diagnosis present

## 2019-03-01 ENCOUNTER — Other Ambulatory Visit: Payer: Self-pay | Admitting: Nurse Practitioner

## 2019-03-01 DIAGNOSIS — Z1231 Encounter for screening mammogram for malignant neoplasm of breast: Secondary | ICD-10-CM

## 2019-03-09 ENCOUNTER — Ambulatory Visit
Admission: RE | Admit: 2019-03-09 | Discharge: 2019-03-09 | Disposition: A | Discharge status: Home / Self Care | Payer: Medicare Other | Source: Ambulatory Visit | Attending: Nurse Practitioner | Admitting: Nurse Practitioner

## 2019-03-09 ENCOUNTER — Other Ambulatory Visit: Payer: Self-pay

## 2019-03-09 ENCOUNTER — Encounter (INDEPENDENT_AMBULATORY_CARE_PROVIDER_SITE_OTHER): Payer: Self-pay

## 2019-03-09 DIAGNOSIS — Z1231 Encounter for screening mammogram for malignant neoplasm of breast: Secondary | ICD-10-CM | POA: Diagnosis present

## 2020-01-06 ENCOUNTER — Ambulatory Visit
Admission: EM | Admit: 2020-01-06 | Discharge: 2020-01-06 | Disposition: A | Discharge status: Home / Self Care | Payer: Medicare Other | Attending: Family Medicine | Admitting: Family Medicine

## 2020-01-06 ENCOUNTER — Other Ambulatory Visit: Payer: Self-pay

## 2020-01-06 DIAGNOSIS — U071 COVID-19: Secondary | ICD-10-CM | POA: Insufficient documentation

## 2020-01-06 NOTE — Discharge Instructions (Signed)
You need to quarantine at home for 10 days from your symptoms started.  After 10 days you can break quarantine if your symptoms have improved and you have not had a fever in 24 hours.  If you develop any shortness of breath, especially shortness of breath at rest, you cannot speak in full sentences, or you develop bluing around your lips to go to the ER for evaluation.

## 2020-01-06 NOTE — ED Provider Notes (Signed)
[facility]    CSN: [id number] Arrival date & time: 01/06/20  0934      History   Chief Complaint Chief Complaint  Patient presents with  . Cough    HPI Evelyn Kirby is a 75 y.o. female.   HPI   75 year old female here for evaluation of nasal congestion for the past 7 days.  Patient reports that she took a home Covid test yesterday and was positive.  She is here today to be retested.  Patient reports that the day before she had received her Covid booster shot.  Patient has been fully vaccinated against Covid but not against flu.  Patient denies fever, cough, runny nose, sore throat, ear pain or pressure, body aches, GI symptoms, or changes to her sense of taste and smell.  Patient reports that she has had an outbreak of Covid at her church but they are no longer doing trips to the outbreak.  Past Medical History:  Diagnosis Date  . Diabetes mellitus without complication ([place])   . Hypercholesteremia   . Hypertension     Patient Active Problem List   Diagnosis Date Noted  . Cutaneous abscess of abdominal wall   . Arthritis 01/08/2015  . B12 deficiency 01/08/2015  . Clinical depression 01/08/2015  . Type 2 diabetes mellitus ([place]) 01/08/2015  . 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 HLD (hyperlipidemia) 01/08/2015  . BP (high blood pressure) 01/08/2015  . Abscess 12/28/2014    Past Surgical History:  Procedure Laterality Date  . ABDOMINAL HYSTERECTOMY    . BREAST BIOPSY Left    neg  . INCISION AND DRAINAGE ABSCESS N/A 12/28/2014   Procedure: INCISION AND DRAINAGE ABSCESS;  Surgeon: Clayburn Pert, MD;  Location: [facility] ORS;  Service: General;  Laterality: N/A;    OB History   No obstetric history on file.      Home Medications    Prior to Admission medications   Medication Sig Start Date End Date Taking? Authorizing Provider  hydrochlorothiazide (HYDRODIURIL) 25 MG tablet Take 1 tablet by mouth daily. 10/05/14  Yes [provider]   naproxen sodium (ALEVE) 220 MG tablet Take 2 tablets by mouth daily.   Yes [provider]  simvastatin (ZOCOR) 40 MG tablet Take 1 tablet by mouth at bedtime. 10/05/14  Yes [provider]  metFORMIN (GLUCOPHAGE) 500 MG tablet Take 1 tablet by mouth daily. 10/05/14 01/06/20  [provider]    Family History Family History  Problem Relation Age of Onset  . Heart failure Mother   . Breast cancer Cousin        2 mat cousins    Social History Social History   Tobacco Use  . Smoking status: Former Smoker    Quit date: 01/10/1986    Years since quitting: 34.0  . Smokeless tobacco: Never Used  Substance Use Topics  . Alcohol use: No  . Drug use: No     Allergies   Patient has no known allergies.   Review of Systems Review of Systems  Constitutional: Negative for activity change, appetite change and fever.  HENT: Positive for congestion. Negative for ear pain, rhinorrhea, sinus pressure, sinus pain and sore throat.   Respiratory: Negative for cough.   Gastrointestinal: Negative for diarrhea, nausea and vomiting.  Musculoskeletal: Negative for arthralgias and myalgias.  Skin: Negative for rash.  Neurological: Negative for headaches.  Hematological: Negative.   Psychiatric/Behavioral: Negative.      Physical Exam Triage Vital Signs ED Triage Vitals  Enc  Vitals Group     BP 01/06/20 1052 (!) 163/60     Pulse Rate 01/06/20 1052 60     Resp 01/06/20 1052 17     Temp 01/06/20 1052 98.4 F (36.9 C)     Temp Source 01/06/20 1052 Oral     SpO2 01/06/20 1052 98 %     Weight 01/06/20 1049 127 lb (57.6 kg)     Height 01/06/20 1049 5\' 1"  (1.549 m)     Head Circumference --      Peak Flow --      Pain Score 01/06/20 1049 0     Pain Loc --      Pain Edu? --      Excl. in GC? --    No data found.  Updated Vital Signs BP (!) 163/60 (BP Location: Right Arm)   Pulse 60   Temp 98.4 F (36.9 C) (Oral)   Resp 17   Ht 5\' 1"  (1.549 m)   Wt 127 lb  (57.6 kg)   SpO2 98%   BMI 24.00 kg/m   Visual Acuity Right Eye Distance:   Left Eye Distance:   Bilateral Distance:    Right Eye Near:   Left Eye Near:    Bilateral Near:     Physical Exam Vitals and nursing note reviewed.  Constitutional:      General: She is not in acute distress.    Appearance: Normal appearance.  HENT:     Head: Normocephalic and atraumatic.     Right Ear: Tympanic membrane, ear canal and external ear normal.     Left Ear: Tympanic membrane, ear canal and external ear normal.     Nose: Nose normal. No congestion.     Mouth/Throat:     Mouth: Mucous membranes are moist.     Pharynx: Oropharynx is clear. No oropharyngeal exudate or posterior oropharyngeal erythema.  Cardiovascular:     Rate and Rhythm: Normal rate and regular rhythm.     Pulses: Normal pulses.     Heart sounds: Normal heart sounds. No murmur heard. No gallop.   Pulmonary:     Effort: Pulmonary effort is normal.     Breath sounds: Normal breath sounds. No wheezing, rhonchi or rales.  Musculoskeletal:     Cervical back: Normal range of motion and neck supple.  Lymphadenopathy:     Cervical: No cervical adenopathy.  Skin:    General: Skin is warm and dry.     Capillary Refill: Capillary refill takes less than 2 seconds.     Findings: No erythema or rash.  Neurological:     General: No focal deficit present.     Mental Status: She is alert and oriented to person, place, and time.  Psychiatric:        Mood and Affect: Mood normal.        Behavior: Behavior normal.        Thought Content: Thought content normal.        Judgment: Judgment normal.      UC Treatments / Results  Labs (all labs ordered are listed, but only abnormal results are displayed) Labs Reviewed  SARS CORONAVIRUS 2 (TAT 6-24 HRS)    EKG   Radiology No results found.  Procedures Procedures (including critical care time)  Medications Ordered in UC Medications - No data to display  Initial  Impression / Assessment and Plan / UC Course  I have reviewed the triage vital signs and the nursing notes.  Pertinent  labs & imaging results that were available during my care of the patient were reviewed by me and considered in my medical decision making (see chart for details).   Patient is here for evaluation of nasal congestion and a positive Covid test at home.  Patient has no other associated symptoms.  Patient's been fully vaccinated and received her booster the day before she took the positive test.  We will send Covid swab and have patient quarantine at home pending the results.  Discussed the need to isolate and then quarantine if positive.  Will discuss ER precautions should they develop.  Patient is completely nontoxic in appearance.   Final Clinical Impressions(s) / UC Diagnoses   Final diagnoses:  COVID-19     Discharge Instructions     You need to quarantine at home for 10 days from your symptoms started.  After 10 days you can break quarantine if your symptoms have improved and you have not had a fever in 24 hours.  If you develop any shortness of breath, especially shortness of breath at rest, you cannot speak in full sentences, or you develop bluing around your lips to go to the ER for evaluation.    ED Prescriptions    None     PDMP not reviewed this encounter.   Becky Augusta, NP 01/06/20 1139

## 2020-01-06 NOTE — ED Triage Notes (Signed)
Patient states that she has been having cough and nasal congestion x 7 days. States that she had a positive test at home yesterday. Reports that she did have her covid booster on Thursday.

## 2020-01-07 LAB — SARS CORONAVIRUS 2 (TAT 6-24 HRS): SARS Coronavirus 2: POSITIVE — AB

## 2020-01-20 ENCOUNTER — Ambulatory Visit
Admission: EM | Admit: 2020-01-20 | Discharge: 2020-01-20 | Disposition: A | Discharge status: Home / Self Care | Payer: Medicare Other | Attending: Family Medicine | Admitting: Family Medicine

## 2020-01-20 ENCOUNTER — Other Ambulatory Visit: Payer: Self-pay

## 2020-01-20 ENCOUNTER — Encounter: Payer: Self-pay | Admitting: Emergency Medicine

## 2020-01-20 DIAGNOSIS — L03211 Cellulitis of face: Secondary | ICD-10-CM | POA: Diagnosis not present

## 2020-01-20 MED ORDER — MUPIROCIN 2 % EX OINT: 1.0000 "application " | TOPICAL_OINTMENT | Freq: Three times a day (TID) | CUTANEOUS | 0 refills | Status: AC

## 2020-01-20 MED ORDER — DOXYCYCLINE HYCLATE 100 MG PO CAPS: 100.0000 mg | ORAL_CAPSULE | Freq: Two times a day (BID) | ORAL | 0 refills | Status: DC

## 2020-01-20 NOTE — Discharge Instructions (Signed)
Medication as prescribed.  Use some OTC Hydrocortisone as well.  Take care  Dr. Adriana Simas

## 2020-01-20 NOTE — ED Triage Notes (Signed)
Patient c/o itchy red rash that started last week.

## 2020-01-20 NOTE — ED Provider Notes (Signed)
[facility]    CSN: [id number] Arrival date & time: 01/20/20  0846      History   Chief Complaint Chief Complaint  Patient presents with  . Rash   HPI  75 year old female presents with above complaint.  Patient states that she had hair removal last week.  She states that subsequently she developed redness, itching, and warmth to the affected area.  She states that there is some crusting.  She is concerned that she has an infection.  She has been applying Neosporin and hydrocortisone without resolution.  No fever.  No other reported symptoms.  No other complaints.  Past Medical History:  Diagnosis Date  . Diabetes mellitus without complication ([facility])   . Hypercholesteremia   . Hypertension     Patient Active Problem List   Diagnosis Date Noted  . Cutaneous abscess of abdominal wall   . Arthritis 01/08/2015  . B12 deficiency 01/08/2015  . Clinical depression 01/08/2015  . Type 2 diabetes mellitus ([facility]) 01/08/2015  . 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 HLD (hyperlipidemia) 01/08/2015  . BP (high blood pressure) 01/08/2015  . Abscess 12/28/2014    Past Surgical History:  Procedure Laterality Date  . ABDOMINAL HYSTERECTOMY    . BREAST BIOPSY Left    neg  . INCISION AND DRAINAGE ABSCESS N/A 12/28/2014   Procedure: INCISION AND DRAINAGE ABSCESS;  Surgeon: Ricarda Frame, MD;  Location: [facility] ORS;  Service: General;  Laterality: N/A;    OB History   No obstetric history on file.      Home Medications    Prior to Admission medications   Medication Sig Start Date End Date Taking? Authorizing Provider  doxycycline (VIBRAMYCIN) 100 MG capsule Take 1 capsule (100 mg total) by mouth 2 (two) times daily. 01/20/20  Yes Durene Dodge G, DO  hydrochlorothiazide (HYDRODIURIL) 25 MG tablet Take 1 tablet by mouth daily. 10/05/14  Yes [provider]  mupirocin ointment (BACTROBAN) 2 % Apply 1 application topically 3 (three) times daily for 7 days. 01/20/20  01/27/20 Yes Caulder Wehner G, DO  naproxen sodium (ALEVE) 220 MG tablet Take 2 tablets by mouth daily.   Yes [provider]  simvastatin (ZOCOR) 40 MG tablet Take 1 tablet by mouth at bedtime. 10/05/14  Yes [provider]  metFORMIN (GLUCOPHAGE) 500 MG tablet Take 1 tablet by mouth daily. 10/05/14 01/06/20  [provider]    Family History Family History  Problem Relation Age of Onset  . Heart failure Mother   . Breast cancer Cousin        2 mat cousins    Social History Social History   Tobacco Use  . Smoking status: Former Smoker    Quit date: 01/10/1986    Years since quitting: 34.0  . Smokeless tobacco: Never Used  Vaping Use  . Vaping Use: Never used  Substance Use Topics  . Alcohol use: No  . Drug use: No     Allergies   Patient has no known allergies.   Review of Systems Review of Systems  Constitutional: Negative.   Skin: Positive for rash.   Physical Exam Triage Vital Signs ED Triage Vitals  Enc Vitals Group     BP 01/20/20 0854 (!) 152/62     Pulse Rate 01/20/20 0854 83     Resp 01/20/20 0854 16     Temp 01/20/20 0854 98 F (36.7 C)     Temp Source 01/20/20 0854 Oral     SpO2 01/20/20 0854 99 %  Weight 01/20/20 0852 129 lb (58.5 kg)     Height 01/20/20 0852 5\' 1"  (1.549 m)     Head Circumference --      Peak Flow --      Pain Score 01/20/20 0852 0     Pain Loc --      Pain Edu? --      Excl. in GC? --    Updated Vital Signs BP (!) 152/62 (BP Location: Left Arm)   Pulse 83   Temp 98 F (36.7 C) (Oral)   Resp 16   Ht 5\' 1"  (1.549 m)   Wt 58.5 kg   SpO2 99%   BMI 24.37 kg/m   Visual Acuity Right Eye Distance:   Left Eye Distance:   Bilateral Distance:    Right Eye Near:   Left Eye Near:    Bilateral Near:     Physical Exam Vitals and nursing note reviewed.  Constitutional:      General: She is not in acute distress.    Appearance: Normal appearance. She is not ill-appearing.  HENT:     Head:  Normocephalic and atraumatic.      Comments: Warmth, erythema at the level location.  Patient has some crusting as well. Cardiovascular:     Rate and Rhythm: Normal rate.     Comments: Regular rhythm but ectopy noted. Pulmonary:     Effort: Pulmonary effort is normal.     Breath sounds: Normal breath sounds. No wheezing, rhonchi or rales.  Neurological:     Mental Status: She is alert.  Psychiatric:        Mood and Affect: Mood normal.        Behavior: Behavior normal.    UC Treatments / Results  Labs (all labs ordered are listed, but only abnormal results are displayed) Labs Reviewed - No data to display  EKG   Radiology No results found.  Procedures Procedures (including critical care time)  Medications Ordered in UC Medications - No data to display  Initial Impression / Assessment and Plan / UC Course  I have reviewed the triage vital signs and the nursing notes.  Pertinent labs & imaging results that were available during my care of the patient were reviewed by me and considered in my medical decision making (see chart for details).    75 year old female presents with cellulitis versus impetigo.  Placing on doxycycline and Bactroban ointment.  Supportive care.  Final Clinical Impressions(s) / UC Diagnoses   Final diagnoses:  Facial cellulitis     Discharge Instructions     Medication as prescribed.  Use some OTC Hydrocortisone as well.  Take care  Dr.    ED Prescriptions    Medication Sig Dispense Auth. Provider   doxycycline (VIBRAMYCIN) 100 MG capsule Take 1 capsule (100 mg total) by mouth 2 (two) times daily. 14 capsule Michelangelo Rindfleisch G, DO   mupirocin ointment (BACTROBAN) 2 % Apply 1 application topically 3 (three) times daily for 7 days. 30 g Adriana Simas, DO     PDMP not reviewed this encounter.   02-09-2003, Tommie Sams 01/20/20 1132

## 2020-03-14 ENCOUNTER — Other Ambulatory Visit: Payer: Self-pay | Admitting: Nurse Practitioner

## 2020-03-14 DIAGNOSIS — Z1231 Encounter for screening mammogram for malignant neoplasm of breast: Secondary | ICD-10-CM

## 2020-03-21 ENCOUNTER — Ambulatory Visit
Admission: RE | Admit: 2020-03-21 | Discharge: 2020-03-21 | Disposition: A | Discharge status: Home / Self Care | Payer: Medicare Other | Source: Ambulatory Visit | Attending: Nurse Practitioner | Admitting: Nurse Practitioner

## 2020-03-21 ENCOUNTER — Other Ambulatory Visit: Payer: Self-pay

## 2020-03-21 DIAGNOSIS — Z1231 Encounter for screening mammogram for malignant neoplasm of breast: Secondary | ICD-10-CM | POA: Insufficient documentation

## 2020-12-21 ENCOUNTER — Other Ambulatory Visit: Payer: Self-pay

## 2020-12-21 ENCOUNTER — Encounter: Payer: Self-pay | Admitting: Emergency Medicine

## 2020-12-21 ENCOUNTER — Ambulatory Visit
Admission: EM | Admit: 2020-12-21 | Discharge: 2020-12-21 | Disposition: A | Discharge status: Home / Self Care | Payer: Medicare Other | Attending: Emergency Medicine | Admitting: Emergency Medicine

## 2020-12-21 DIAGNOSIS — U071 COVID-19: Secondary | ICD-10-CM | POA: Diagnosis not present

## 2020-12-21 MED ORDER — FLUTICASONE PROPIONATE 50 MCG/ACT NA SUSP: 2.0000 | Freq: Every day | NASAL | 0 refills | Status: DC

## 2020-12-21 MED ORDER — FLUTICASONE PROPIONATE 50 MCG/ACT NA SUSP: 2.0000 | Freq: Every day | NASAL | 0 refills | Status: AC

## 2020-12-21 MED ORDER — MOLNUPIRAVIR EUA 200MG CAPSULE: 4.0000 | ORAL_CAPSULE | Freq: Two times a day (BID) | ORAL | 0 refills | Status: AC

## 2020-12-21 NOTE — ED Provider Notes (Signed)
HPI  SUBJECTIVE:  Evelyn Kirby is a 75 y.o. female who presents with fatigue, nasal congestion, clear rhinorrhea, sore throat starting yesterday.  No fevers, body aches, headaches, loss of sense of smell or taste, cough, shortness of breath, nausea, vomiting, abdominal pain.  Possible diarrhea, patient is not sure.  She had a positive home COVID test this morning.  No known exposure to COVID, she got 3 doses of the COVID-vaccine.  She took an antipyretic within 6 hours of evaluation.  She has tried NyQuil high blood pressure with improvement in her symptoms.  No aggravating factors.  She has a past medical history of hypertension, COVID in January 22 and hypercholesterolemia.  [id number], Hermenia Fiscal, NP   Past Medical History:  Diagnosis Date   Diabetes mellitus without complication ([facility])    Hypercholesteremia    Hypertension     Past Surgical History:  Procedure Laterality Date   ABDOMINAL HYSTERECTOMY     BREAST BIOPSY Left    neg   INCISION AND DRAINAGE ABSCESS N/A 12/28/2014   Procedure: INCISION AND DRAINAGE ABSCESS;  Surgeon: Ricarda Frame, MD;  Location: [facility] OR[facility];  Service: General;  Laterality: N/A;    Family History  Problem Relation Age of Onset   Heart failure Mother    Breast cancer Cousin        2 mat cousins    Social History   Tobacco Use   Smoking status: Former    Types: Cigarettes    Quit date: 01/10/1986    Years since quitting: 34.9   Smokeless tobacco: Never  Vaping Use   Vaping Use: Never used  Substance Use Topics   Alcohol use: No   Drug use: No    No current facility-administered medications for this encounter.  Current Outpatient Medications:    hydrochlorothiazide (HYDRODIURIL) 25 MG tablet, Take 1 tablet by mouth daily., Disp: , Rfl:    losartan (COZAAR) 100 MG tablet, TAKE ONE (1) TABLET BY MOUTH ONCE DAILY, Disp: , Rfl:    molnupiravir EUA (LAGEVRIO) 200 mg CAPS capsule, Take 4 capsules (800 mg total) by mouth 2 (two) times  daily for 5 days., Disp: 40 capsule, Rfl: 0   simvastatin (ZOCOR) 40 MG tablet, Take 1 tablet by mouth at bedtime., Disp: , Rfl:    fluticasone (FLONASE) 50 MCG/ACT nasal spray, Place 2 sprays into both nostrils daily., Disp: 16 g, Rfl: 0   naproxen sodium (ALEVE) 220 MG tablet, Take 2 tablets by mouth daily., Disp: , Rfl:   No Known Allergies   ROS  As noted in HPI.   Physical Exam  BP (!) 174/60 (BP Location: Left Arm)    Pulse 71    Temp 98.2 F (36.8 C) (Oral)    Resp 14    Ht 5\' 1"  (1.549 m)    Wt 59 kg    SpO2 98%    BMI 24.56 kg/m   Constitutional: Well developed, well nourished, no acute distress Eyes:  EOMI, conjunctiva normal bilaterally HENT: Normocephalic, atraumatic,mucus membranes moist.  Mild nasal congestion Respiratory: Normal inspiratory effort, lungs clear bilateral Cardiovascular: Normal rate, regular rhythm, no murmurs rubs or gallops GI: nondistended skin: No rash, skin intact Musculoskeletal: no deformities Neurologic: Alert & oriented x 3, no focal neuro deficits Psychiatric: Speech and behavior appropriate   ED Course   Medications - No data to display  No orders of the defined types were placed in this encounter.   No results found for this or any previous visit (  from the past 24 hour(s)). No results found.  ED Clinical Impression  1. COVID-19      ED Assessment/Plan  Patient with a positive home COVID test, symptoms started yesterday.  She qualifies for Molnupiravir.  Also advised saline nasal irrigation, Flonase, continue home medication as needed.  Quarantine for 5 days, then masking for 5 days thereafter.  Discussed MDM, treatment plan, and plan for follow-up with patient. patient agrees with plan.   Meds ordered this encounter  Medications   DISCONTD: fluticasone (FLONASE) 50 MCG/ACT nasal spray    Sig: Place 2 sprays into both nostrils daily.    Dispense:  16 g    Refill:  0   fluticasone (FLONASE) 50 MCG/ACT nasal spray     Sig: Place 2 sprays into both nostrils daily.    Dispense:  16 g    Refill:  0   molnupiravir EUA (LAGEVRIO) 200 mg CAPS capsule    Sig: Take 4 capsules (800 mg total) by mouth 2 (two) times daily for 5 days.    Dispense:  40 capsule    Refill:  0      *This clinic note was created using Scientist, clinical (histocompatibility and immunogenetics). Therefore, there may be occasional mistakes despite careful proofreading.  ?    Domenick Gong, MD 12/22/20 [phone]

## 2020-12-21 NOTE — Discharge Instructions (Addendum)
Finish the [employer], even if you feel better.  Continue your cold medication as needed.  If the nasal congestion, runny nose becomes a problem, start saline nasal irrigation with a Lloyd Huger Med rinse and distilled water as often as you want and Flonase

## 2020-12-21 NOTE — ED Triage Notes (Signed)
Patient c/o fatigue that started yesterday and runny nose that started this morning.  Patient states that she took a home covid test this morning and was positive.

## 2021-05-20 DIAGNOSIS — L308 Other specified dermatitis: Secondary | ICD-10-CM | POA: Diagnosis not present

## 2021-05-20 DIAGNOSIS — L900 Lichen sclerosus et atrophicus: Secondary | ICD-10-CM | POA: Diagnosis not present

## 2021-05-20 DIAGNOSIS — L578 Other skin changes due to chronic exposure to nonionizing radiation: Secondary | ICD-10-CM | POA: Diagnosis not present

## 2021-05-27 ENCOUNTER — Other Ambulatory Visit: Payer: Self-pay | Admitting: Nurse Practitioner

## 2021-05-27 DIAGNOSIS — Z0000 Encounter for general adult medical examination without abnormal findings: Secondary | ICD-10-CM | POA: Diagnosis not present

## 2021-05-27 DIAGNOSIS — E782 Mixed hyperlipidemia: Secondary | ICD-10-CM | POA: Diagnosis not present

## 2021-05-27 DIAGNOSIS — Z1231 Encounter for screening mammogram for malignant neoplasm of breast: Secondary | ICD-10-CM | POA: Diagnosis not present

## 2021-05-27 DIAGNOSIS — E119 Type 2 diabetes mellitus without complications: Secondary | ICD-10-CM | POA: Diagnosis not present

## 2021-05-27 DIAGNOSIS — I10 Essential (primary) hypertension: Secondary | ICD-10-CM | POA: Diagnosis not present

## 2021-05-27 DIAGNOSIS — Z79899 Other long term (current) drug therapy: Secondary | ICD-10-CM | POA: Diagnosis not present

## 2021-05-27 DIAGNOSIS — N289 Disorder of kidney and ureter, unspecified: Secondary | ICD-10-CM | POA: Diagnosis not present

## 2021-05-27 DIAGNOSIS — H9190 Unspecified hearing loss, unspecified ear: Secondary | ICD-10-CM | POA: Diagnosis not present

## 2021-05-27 DIAGNOSIS — Z1389 Encounter for screening for other disorder: Secondary | ICD-10-CM | POA: Diagnosis not present

## 2021-06-26 ENCOUNTER — Ambulatory Visit
Admission: RE | Admit: 2021-06-26 | Discharge: 2021-06-26 | Disposition: A | Discharge status: Home / Self Care | Payer: Medicare HMO | Source: Ambulatory Visit | Attending: Nurse Practitioner | Admitting: Nurse Practitioner

## 2021-06-26 DIAGNOSIS — Z1231 Encounter for screening mammogram for malignant neoplasm of breast: Secondary | ICD-10-CM

## 2021-06-27 DIAGNOSIS — N289 Disorder of kidney and ureter, unspecified: Secondary | ICD-10-CM | POA: Diagnosis not present

## 2021-07-03 DIAGNOSIS — H903 Sensorineural hearing loss, bilateral: Secondary | ICD-10-CM | POA: Diagnosis not present

## 2021-07-03 DIAGNOSIS — H9222 Otorrhagia, left ear: Secondary | ICD-10-CM | POA: Diagnosis not present

## 2021-11-14 DIAGNOSIS — Z0100 Encounter for examination of eyes and vision without abnormal findings: Secondary | ICD-10-CM | POA: Diagnosis not present

## 2021-11-14 DIAGNOSIS — H40003 Preglaucoma, unspecified, bilateral: Secondary | ICD-10-CM | POA: Diagnosis not present

## 2021-12-02 DIAGNOSIS — Z23 Encounter for immunization: Secondary | ICD-10-CM | POA: Diagnosis not present

## 2021-12-02 DIAGNOSIS — E782 Mixed hyperlipidemia: Secondary | ICD-10-CM | POA: Diagnosis not present

## 2021-12-02 DIAGNOSIS — N183 Chronic kidney disease, stage 3 unspecified: Secondary | ICD-10-CM | POA: Diagnosis not present

## 2021-12-02 DIAGNOSIS — I129 Hypertensive chronic kidney disease with stage 1 through stage 4 chronic kidney disease, or unspecified chronic kidney disease: Secondary | ICD-10-CM | POA: Diagnosis not present

## 2021-12-02 DIAGNOSIS — Z79899 Other long term (current) drug therapy: Secondary | ICD-10-CM | POA: Diagnosis not present

## 2021-12-02 DIAGNOSIS — Z87891 Personal history of nicotine dependence: Secondary | ICD-10-CM | POA: Diagnosis not present

## 2021-12-02 DIAGNOSIS — E1122 Type 2 diabetes mellitus with diabetic chronic kidney disease: Secondary | ICD-10-CM | POA: Diagnosis not present

## 2022-01-10 DIAGNOSIS — H2511 Age-related nuclear cataract, right eye: Secondary | ICD-10-CM | POA: Diagnosis not present

## 2022-05-21 DIAGNOSIS — I509 Heart failure, unspecified: Secondary | ICD-10-CM | POA: Diagnosis not present

## 2022-05-21 DIAGNOSIS — Z86018 Personal history of other benign neoplasm: Secondary | ICD-10-CM | POA: Diagnosis not present

## 2022-05-21 DIAGNOSIS — L249 Irritant contact dermatitis, unspecified cause: Secondary | ICD-10-CM | POA: Diagnosis not present

## 2022-05-21 DIAGNOSIS — L578 Other skin changes due to chronic exposure to nonionizing radiation: Secondary | ICD-10-CM | POA: Diagnosis not present

## 2022-05-21 DIAGNOSIS — L900 Lichen sclerosus et atrophicus: Secondary | ICD-10-CM | POA: Diagnosis not present

## 2022-05-21 DIAGNOSIS — Z872 Personal history of diseases of the skin and subcutaneous tissue: Secondary | ICD-10-CM | POA: Diagnosis not present

## 2022-05-21 DIAGNOSIS — L308 Other specified dermatitis: Secondary | ICD-10-CM | POA: Diagnosis not present

## 2022-06-09 ENCOUNTER — Other Ambulatory Visit: Payer: Self-pay | Admitting: Nurse Practitioner

## 2022-06-09 DIAGNOSIS — I10 Essential (primary) hypertension: Secondary | ICD-10-CM | POA: Diagnosis not present

## 2022-06-09 DIAGNOSIS — I129 Hypertensive chronic kidney disease with stage 1 through stage 4 chronic kidney disease, or unspecified chronic kidney disease: Secondary | ICD-10-CM | POA: Diagnosis not present

## 2022-06-09 DIAGNOSIS — Z79899 Other long term (current) drug therapy: Secondary | ICD-10-CM | POA: Diagnosis not present

## 2022-06-09 DIAGNOSIS — E538 Deficiency of other specified B group vitamins: Secondary | ICD-10-CM | POA: Diagnosis not present

## 2022-06-09 DIAGNOSIS — Z0000 Encounter for general adult medical examination without abnormal findings: Secondary | ICD-10-CM | POA: Diagnosis not present

## 2022-06-09 DIAGNOSIS — E782 Mixed hyperlipidemia: Secondary | ICD-10-CM | POA: Diagnosis not present

## 2022-06-09 DIAGNOSIS — Z1231 Encounter for screening mammogram for malignant neoplasm of breast: Secondary | ICD-10-CM

## 2022-06-09 DIAGNOSIS — E1122 Type 2 diabetes mellitus with diabetic chronic kidney disease: Secondary | ICD-10-CM | POA: Diagnosis not present

## 2022-06-09 DIAGNOSIS — N183 Chronic kidney disease, stage 3 unspecified: Secondary | ICD-10-CM | POA: Diagnosis not present

## 2022-06-09 DIAGNOSIS — Z1331 Encounter for screening for depression: Secondary | ICD-10-CM | POA: Diagnosis not present

## 2022-06-29 ENCOUNTER — Ambulatory Visit
Admission: RE | Admit: 2022-06-29 | Discharge: 2022-06-29 | Disposition: A | Discharge status: Home / Self Care | Payer: Medicare HMO | Source: Ambulatory Visit | Attending: Nurse Practitioner | Admitting: Nurse Practitioner

## 2022-06-29 DIAGNOSIS — Z1231 Encounter for screening mammogram for malignant neoplasm of breast: Secondary | ICD-10-CM | POA: Diagnosis not present

## 2022-11-17 DIAGNOSIS — E119 Type 2 diabetes mellitus without complications: Secondary | ICD-10-CM | POA: Diagnosis not present

## 2022-11-17 DIAGNOSIS — H43812 Vitreous degeneration, left eye: Secondary | ICD-10-CM | POA: Diagnosis not present

## 2022-11-17 DIAGNOSIS — Z0100 Encounter for examination of eyes and vision without abnormal findings: Secondary | ICD-10-CM | POA: Diagnosis not present

## 2022-11-17 DIAGNOSIS — H2513 Age-related nuclear cataract, bilateral: Secondary | ICD-10-CM | POA: Diagnosis not present

## 2022-11-17 DIAGNOSIS — H40003 Preglaucoma, unspecified, bilateral: Secondary | ICD-10-CM | POA: Diagnosis not present

## 2022-12-09 DIAGNOSIS — N183 Chronic kidney disease, stage 3 unspecified: Secondary | ICD-10-CM | POA: Diagnosis not present

## 2022-12-09 DIAGNOSIS — E1122 Type 2 diabetes mellitus with diabetic chronic kidney disease: Secondary | ICD-10-CM | POA: Diagnosis not present

## 2022-12-09 DIAGNOSIS — Z1331 Encounter for screening for depression: Secondary | ICD-10-CM | POA: Diagnosis not present

## 2022-12-09 DIAGNOSIS — Z79899 Other long term (current) drug therapy: Secondary | ICD-10-CM | POA: Diagnosis not present

## 2022-12-09 DIAGNOSIS — Z23 Encounter for immunization: Secondary | ICD-10-CM | POA: Diagnosis not present

## 2022-12-09 DIAGNOSIS — I129 Hypertensive chronic kidney disease with stage 1 through stage 4 chronic kidney disease, or unspecified chronic kidney disease: Secondary | ICD-10-CM | POA: Diagnosis not present

## 2022-12-09 DIAGNOSIS — E782 Mixed hyperlipidemia: Secondary | ICD-10-CM | POA: Diagnosis not present

## 2022-12-09 DIAGNOSIS — Z87891 Personal history of nicotine dependence: Secondary | ICD-10-CM | POA: Diagnosis not present

## 2022-12-11 ENCOUNTER — Encounter: Payer: Self-pay | Admitting: Ophthalmology

## 2022-12-15 DIAGNOSIS — H2512 Age-related nuclear cataract, left eye: Secondary | ICD-10-CM | POA: Diagnosis not present

## 2022-12-16 ENCOUNTER — Encounter: Payer: Self-pay | Admitting: Ophthalmology

## 2022-12-16 NOTE — Anesthesia Preprocedure Evaluation (Addendum)
Anesthesia Evaluation  Patient identified by MRN, date of birth, ID band Patient awake    Reviewed: Allergy & Precautions, H&P , NPO status , Patient's Chart, lab work & pertinent test results  Airway Mallampati: II  TM Distance: >3 FB Neck ROM: Full    Dental no notable dental hx.    Pulmonary former smoker   Pulmonary exam normal breath sounds clear to auscultation       Cardiovascular hypertension, Normal cardiovascular exam Rhythm:Regular Rate:Normal     Neuro/Psych  PSYCHIATRIC DISORDERS  Depression    negative neurological ROS  negative psych ROS   GI/Hepatic negative GI ROS, Neg liver ROS,,,  Endo/Other  negative endocrine ROSdiabetes    Renal/GU negative Renal ROS  negative genitourinary   Musculoskeletal negative musculoskeletal ROS (+) Arthritis ,    Abdominal   Peds negative pediatric ROS (+)  Hematology negative hematology ROS (+)   Anesthesia Other Findings Hypertension  Hypercholesteremia Pre-diabetes  Sensorineural hearing loss, bilateral Car sickness     Reproductive/Obstetrics negative OB ROS                             Anesthesia Physical Anesthesia Plan  ASA: 2  Anesthesia Plan: MAC   Post-op Pain Management:    Induction: Intravenous  PONV Risk Score and Plan:   Airway Management Planned: Natural Airway and Nasal Cannula  Additional Equipment:   Intra-op Plan:   Post-operative Plan:   Informed Consent: I have reviewed the patients History and Physical, chart, labs and discussed the procedure including the risks, benefits and alternatives for the proposed anesthesia with the patient or authorized representative who has indicated his/her understanding and acceptance.     Dental Advisory Given  Plan Discussed with: Anesthesiologist, CRNA and Surgeon  Anesthesia Plan Comments: (Patient consented for risks of anesthesia including but not limited to:   - adverse reactions to medications - damage to eyes, teeth, lips or other oral mucosa - nerve damage due to positioning  - sore throat or hoarseness - Damage to heart, brain, nerves, lungs, other parts of body or loss of life  Patient voiced understanding and assent.)        Anesthesia Quick Evaluation

## 2022-12-17 NOTE — Discharge Instructions (Signed)

## 2022-12-21 ENCOUNTER — Ambulatory Visit
Admission: RE | Admit: 2022-12-21 | Discharge: 2022-12-21 | Disposition: A | Discharge status: Home / Self Care | Payer: Medicare HMO | Attending: Ophthalmology | Admitting: Ophthalmology

## 2022-12-21 ENCOUNTER — Encounter
Admission: RE | Disposition: A | Discharge status: Home / Self Care | Payer: Self-pay | Source: Home / Self Care | Attending: Ophthalmology

## 2022-12-21 ENCOUNTER — Ambulatory Visit: Payer: Medicare HMO | Admitting: Anesthesiology

## 2022-12-21 ENCOUNTER — Encounter: Payer: Self-pay | Admitting: Ophthalmology

## 2022-12-21 ENCOUNTER — Other Ambulatory Visit: Payer: Self-pay

## 2022-12-21 DIAGNOSIS — Z87891 Personal history of nicotine dependence: Secondary | ICD-10-CM | POA: Diagnosis not present

## 2022-12-21 DIAGNOSIS — H269 Unspecified cataract: Secondary | ICD-10-CM | POA: Diagnosis not present

## 2022-12-21 DIAGNOSIS — I10 Essential (primary) hypertension: Secondary | ICD-10-CM | POA: Insufficient documentation

## 2022-12-21 DIAGNOSIS — H2512 Age-related nuclear cataract, left eye: Secondary | ICD-10-CM | POA: Diagnosis not present

## 2022-12-21 DIAGNOSIS — R7303 Prediabetes: Secondary | ICD-10-CM | POA: Insufficient documentation

## 2022-12-21 HISTORY — DX: Prediabetes: R73.03

## 2022-12-21 HISTORY — PX: CATARACT EXTRACTION W/PHACO: SHX586

## 2022-12-21 HISTORY — DX: Sensorineural hearing loss, bilateral: H90.3

## 2022-12-21 SURGERY — PHACOEMULSIFICATION, CATARACT, WITH IOL INSERTION
Anesthesia: Monitor Anesthesia Care | Site: Eye | Laterality: Left

## 2022-12-21 MED ORDER — MOXIFLOXACIN HCL 0.5 % OP SOLN
OPHTHALMIC | Status: DC | PRN
  Administered 2022-12-21: .2 mL via OPHTHALMIC

## 2022-12-21 MED ORDER — TETRACAINE HCL 0.5 % OP SOLN
OPHTHALMIC | Status: AC
  Filled 2022-12-21: qty 4

## 2022-12-21 MED ORDER — LIDOCAINE HCL (PF) 2 % IJ SOLN
INTRAOCULAR | Status: DC | PRN
  Administered 2022-12-21: 1 mL via INTRAOCULAR

## 2022-12-21 MED ORDER — TETRACAINE HCL 0.5 % OP SOLN
1.0000 [drp] | OPHTHALMIC | Status: DC | PRN
  Administered 2022-12-21 (×3): 1 [drp] via OPHTHALMIC

## 2022-12-21 MED ORDER — MIDAZOLAM HCL 2 MG/2ML IJ SOLN
INTRAMUSCULAR | Status: AC
  Filled 2022-12-21: qty 2

## 2022-12-21 MED ORDER — SIGHTPATH DOSE#1 NA HYALUR & NA CHOND-NA HYALUR IO KIT
PACK | INTRAOCULAR | Status: DC | PRN
  Administered 2022-12-21: 1 via OPHTHALMIC

## 2022-12-21 MED ORDER — SIGHTPATH DOSE#1 BSS IO SOLN
INTRAOCULAR | Status: DC | PRN
  Administered 2022-12-21: 15 mL

## 2022-12-21 MED ORDER — SIGHTPATH DOSE#1 BSS IO SOLN
INTRAOCULAR | Status: DC | PRN
  Administered 2022-12-21: 90 mL via OPHTHALMIC

## 2022-12-21 MED ORDER — MIDAZOLAM HCL 2 MG/2ML IJ SOLN
INTRAMUSCULAR | Status: DC | PRN
  Administered 2022-12-21: 2 mg via INTRAVENOUS

## 2022-12-21 MED ORDER — ARMC OPHTHALMIC DILATING DROPS
1.0000 | OPHTHALMIC | Status: DC | PRN
  Administered 2022-12-21 (×3): 1 via OPHTHALMIC

## 2022-12-21 SURGICAL SUPPLY — 10 items
CATARACT SUITE SIGHTPATH (MISCELLANEOUS) ×1
DISSECTOR HYDRO NUCLEUS 50X22 (MISCELLANEOUS) ×1 IMPLANT
FEE CATARACT SUITE SIGHTPATH (MISCELLANEOUS) ×1 IMPLANT
GLOVE PI ULTRA LF STRL 7.5 (GLOVE) ×1 IMPLANT
GLOVE SURG POLYISOPRENE 8.5 (GLOVE) ×1
GLOVE SURG SYN 8.5 PF PI BL (GLOVE) ×1 IMPLANT
LENS IOL TECNIS EYHANCE 23.0 (Intraocular Lens) IMPLANT
NDL FILTER BLUNT 18X1 1/2 (NEEDLE) ×1 IMPLANT
NEEDLE FILTER BLUNT 18X1 1/2 (NEEDLE) ×1
SYR 5ML LL (SYRINGE) ×1 IMPLANT

## 2022-12-21 NOTE — H&P (Signed)
[facility]   Primary Care Physician:  Myrene Buddy, NP Ophthalmologist: Dr. Willey Blade  Pre-Procedure History & Physical: HPI:  Evelyn Kirby is a 77 y.o. female here for cataract surgery.   Past Medical History:  Diagnosis Date   Hypercholesteremia    Hypertension    Pre-diabetes    Sensorineural hearing loss, bilateral     Past Surgical History:  Procedure Laterality Date   ABDOMINAL HYSTERECTOMY     BREAST BIOPSY Left    neg   INCISION AND DRAINAGE ABSCESS N/A 12/28/2014   Procedure: INCISION AND DRAINAGE ABSCESS;  Surgeon: Ricarda Frame, MD;  Location: [facility] ORS;  Service: General;  Laterality: N/A;    Prior to Admission medications   Medication Sig Start Date End Date Taking? Authorizing Provider  amLODipine (NORVASC) 5 MG tablet Take 5 mg by mouth daily.   Yes [provider]  hydrochlorothiazide (HYDRODIURIL) 25 MG tablet Take 1 tablet by mouth daily. 10/05/14  Yes [provider]  losartan (COZAAR) 100 MG tablet TAKE ONE (1) TABLET BY MOUTH ONCE DAILY 09/18/20  Yes [provider]  naproxen sodium (ALEVE) 220 MG tablet Take 2 tablets by mouth daily.   Yes [provider]  rosuvastatin (CRESTOR) 10 MG tablet Take 10 mg by mouth daily.   Yes [provider]  simvastatin (ZOCOR) 40 MG tablet Take 1 tablet by mouth at bedtime. 10/05/14  Yes [provider]  fluticasone (FLONASE) 50 MCG/ACT nasal spray Place 2 sprays into both nostrils daily. Patient not taking: Reported on 12/11/2022 12/21/20   Domenick Gong, MD  metFORMIN (GLUCOPHAGE) 500 MG tablet Take 1 tablet by mouth daily. 10/05/14 01/06/20  [provider]    Allergies as of 11/19/2022   (No Known Allergies)    Family History  Problem Relation Age of Onset   Heart failure Mother    Breast cancer Cousin        2 mat cousins    Social History   Socioeconomic History   Marital status: Married    Spouse name: Not on file    Number of children: Not on file   Years of education: Not on file   Highest education level: Not on file  Occupational History   Not on file  Tobacco Use   Smoking status: Former    Current packs/day: 0.00    Types: Cigarettes    Quit date: 01/10/1986    Years since quitting: 36.9   Smokeless tobacco: Never  Vaping Use   Vaping status: Never Used  Substance and Sexual Activity   Alcohol use: No   Drug use: No   Sexual activity: Not on file  Other Topics Concern   Not on file  Social History Narrative   Not on file   Social Drivers of Health   Financial Resource Strain: Not on file  Food Insecurity: Not on file  Transportation Needs: Not on file  Physical Activity: Not on file  Stress: Not on file  Social Connections: Not on file  Intimate Partner Violence: Not on file    Review of Systems: See HPI, otherwise negative ROS  Physical Exam: BP (!) 158/82   Pulse 74   Temp 99.5 F (37.5 C) (Temporal)   Resp (!) 1   Ht 5' 1.5" (1.562 m)   Wt 57.7 kg   SpO2 95%   BMI 23.65 kg/m  General:   Alert, cooperative in NAD Head:  Normocephalic and atraumatic. Respiratory:  Normal work of  breathing. Cardiovascular:  RRR  Impression/Plan: Evelyn Kirby is here for cataract surgery.  Risks, benefits, limitations, and alternatives regarding cataract surgery have been reviewed with the patient.  Questions have been answered.  All parties agreeable.   Willey Blade, MD  12/21/2022, 7:49 AM

## 2022-12-21 NOTE — Anesthesia Postprocedure Evaluation (Signed)
Anesthesia Post Note  Patient: Evelyn Kirby  Procedure(s) Performed: CATARACT EXTRACTION PHACO AND INTRAOCULAR LENS PLACEMENT (IOC) LEFT DIABETIC  5.15  00:37.5 (Left: Eye)  Patient location during evaluation: PACU Anesthesia Type: MAC Level of consciousness: awake and alert Pain management: pain level controlled Vital Signs Assessment: post-procedure vital signs reviewed and stable Respiratory status: spontaneous breathing, nonlabored ventilation, respiratory function stable and patient connected to nasal cannula oxygen Cardiovascular status: stable and blood pressure returned to baseline Postop Assessment: no apparent nausea or vomiting Anesthetic complications: no   No notable events documented.   Last Vitals:  Vitals:   12/21/22 0816 12/21/22 0820  BP:  136/62  Pulse:  79  Resp: 20 16  Temp: 36.9 C   SpO2:  96%    Last Pain:  Vitals:   12/21/22 0820  TempSrc:   PainSc: 0-No pain                 Dwain Huhn C Zenaya Ulatowski

## 2022-12-21 NOTE — Transfer of Care (Signed)
Immediate Anesthesia Transfer of Care Note  Patient: Evelyn Kirby  Procedure(s) Performed: CATARACT EXTRACTION PHACO AND INTRAOCULAR LENS PLACEMENT (IOC) LEFT DIABETIC  5.15  00:37.5 (Left: Eye)  Patient Location: PACU  Anesthesia Type: MAC  Level of Consciousness: awake, alert  and patient cooperative  Airway and Oxygen Therapy: Patient Spontanous Breathing and Patient connected to supplemental oxygen  Post-op Assessment: Post-op Vital signs reviewed, Patient's Cardiovascular Status Stable, Respiratory Function Stable, Patent Airway and No signs of Nausea or vomiting  Post-op Vital Signs: Reviewed and stable  Complications: No notable events documented.

## 2022-12-21 NOTE — Op Note (Signed)
OPERATIVE NOTE  Evelyn Kirby [id number] 12/21/2022   PREOPERATIVE DIAGNOSIS:  Nuclear sclerotic cataract left eye.  H25.12   POSTOPERATIVE DIAGNOSIS:    Nuclear sclerotic cataract left eye.     PROCEDURE:  Phacoemusification with posterior chamber intraocular lens placement of the left eye   LENS:  * No implants in log *    Procedure(s): CATARACT EXTRACTION PHACO AND INTRAOCULAR LENS PLACEMENT (IOC) LEFT DIABETIC  5.15  00:37.5 (Left)  SURGEON:  Willey Blade, MD, MPH   ANESTHESIA:  Topical with tetracaine drops augmented with 1% preservative-free intracameral lidocaine.  ESTIMATED BLOOD LOSS: <1 mL   COMPLICATIONS:  None.   DESCRIPTION OF PROCEDURE:  The patient was identified in the holding room and transported to the operating room and placed in the supine position under the operating microscope.  The left eye was identified as the operative eye and it was prepped and draped in the usual sterile ophthalmic fashion.   A 1.0 millimeter clear-corneal paracentesis was made at the 5:00 position. 0.5 ml of preservative-free 1% lidocaine with epinephrine was injected into the anterior chamber.  The anterior chamber was filled with viscoelastic.  A 2.4 millimeter keratome was used to make a near-clear corneal incision at the 2:00 position.  A curvilinear capsulorrhexis was made with a cystotome and capsulorrhexis forceps.  Balanced salt solution was used to hydrodissect and hydrodelineate the nucleus.   Phacoemulsification was then used in stop and chop fashion to remove the lens nucleus and epinucleus.  The remaining cortex was then removed using the irrigation and aspiration handpiece. Viscoelastic was then placed into the capsular bag to distend it for lens placement.  A lens was then injected into the capsular bag.  The remaining viscoelastic was aspirated.   Wounds were hydrated with balanced salt solution.  The anterior chamber was inflated to a physiologic pressure with  balanced salt solution.   Intracameral vigamox 0.1 mL undiltued was injected into the eye and a drop placed onto the ocular surface.  No wound leaks were noted.  The patient was taken to the recovery room in stable condition without complications of anesthesia or surgery  Willey Blade 12/21/2022, 8:15 AM

## 2022-12-22 ENCOUNTER — Encounter: Payer: Self-pay | Admitting: Ophthalmology

## 2022-12-22 DIAGNOSIS — H2511 Age-related nuclear cataract, right eye: Secondary | ICD-10-CM | POA: Diagnosis not present

## 2022-12-28 NOTE — Anesthesia Preprocedure Evaluation (Signed)
Anesthesia Evaluation    Airway        Dental   Pulmonary former smoker          Cardiovascular hypertension,      Neuro/Psych    GI/Hepatic   Endo/Other    Renal/GU      Musculoskeletal   Abdominal   Peds  Hematology   Anesthesia Other Findings Previous cataract surgery 12-21-22 with Dr. Juel Burrow anesthesiologist, Bufford Lope CRNA, previously versed 2 mg IV  Hypertension  Hypercholesteremia Pre-diabetes  Sensorineural hearing loss, bilateral    Reproductive/Obstetrics                              Anesthesia Physical Anesthesia Plan Anesthesia Quick Evaluation

## 2023-01-07 NOTE — Discharge Instructions (Signed)

## 2023-01-11 ENCOUNTER — Encounter
Admission: RE | Disposition: A | Discharge status: Home / Self Care | Payer: Self-pay | Source: Home / Self Care | Attending: Ophthalmology

## 2023-01-11 ENCOUNTER — Encounter: Payer: Self-pay | Admitting: Ophthalmology

## 2023-01-11 ENCOUNTER — Ambulatory Visit
Admission: RE | Admit: 2023-01-11 | Discharge: 2023-01-11 | Disposition: A | Discharge status: Home / Self Care | Payer: Medicare HMO | Attending: Ophthalmology | Admitting: Ophthalmology

## 2023-01-11 ENCOUNTER — Ambulatory Visit: Payer: Medicare HMO | Admitting: Anesthesiology

## 2023-01-11 ENCOUNTER — Other Ambulatory Visit: Payer: Self-pay

## 2023-01-11 DIAGNOSIS — Z87891 Personal history of nicotine dependence: Secondary | ICD-10-CM | POA: Insufficient documentation

## 2023-01-11 DIAGNOSIS — E1136 Type 2 diabetes mellitus with diabetic cataract: Secondary | ICD-10-CM | POA: Diagnosis not present

## 2023-01-11 DIAGNOSIS — I10 Essential (primary) hypertension: Secondary | ICD-10-CM | POA: Diagnosis not present

## 2023-01-11 DIAGNOSIS — H2511 Age-related nuclear cataract, right eye: Secondary | ICD-10-CM | POA: Diagnosis not present

## 2023-01-11 DIAGNOSIS — Z7984 Long term (current) use of oral hypoglycemic drugs: Secondary | ICD-10-CM | POA: Diagnosis not present

## 2023-01-11 HISTORY — PX: CATARACT EXTRACTION W/PHACO: SHX586

## 2023-01-11 SURGERY — PHACOEMULSIFICATION, CATARACT, WITH IOL INSERTION
Anesthesia: Monitor Anesthesia Care | Site: Eye | Laterality: Right

## 2023-01-11 MED ORDER — LIDOCAINE HCL (PF) 2 % IJ SOLN
INTRAOCULAR | Status: DC | PRN
  Administered 2023-01-11: 1 mL via INTRAOCULAR

## 2023-01-11 MED ORDER — TETRACAINE HCL 0.5 % OP SOLN
OPHTHALMIC | Status: AC
  Filled 2023-01-11: qty 4

## 2023-01-11 MED ORDER — SIGHTPATH DOSE#1 NA HYALUR & NA CHOND-NA HYALUR IO KIT
PACK | INTRAOCULAR | Status: DC | PRN
  Administered 2023-01-11: 1 via OPHTHALMIC

## 2023-01-11 MED ORDER — ARMC OPHTHALMIC DILATING DROPS
OPHTHALMIC | Status: AC
  Filled 2023-01-11: qty 0.5

## 2023-01-11 MED ORDER — MIDAZOLAM HCL 2 MG/2ML IJ SOLN
INTRAMUSCULAR | Status: DC | PRN
  Administered 2023-01-11 (×2): 1 mg via INTRAVENOUS

## 2023-01-11 MED ORDER — ARMC OPHTHALMIC DILATING DROPS
1.0000 | OPHTHALMIC | Status: DC | PRN
  Administered 2023-01-11 (×3): 1 via OPHTHALMIC

## 2023-01-11 MED ORDER — SIGHTPATH DOSE#1 BSS IO SOLN
INTRAOCULAR | Status: DC | PRN
  Administered 2023-01-11: 115 mL via OPHTHALMIC

## 2023-01-11 MED ORDER — FENTANYL CITRATE (PF) 100 MCG/2ML IJ SOLN
INTRAMUSCULAR | Status: AC
  Filled 2023-01-11: qty 2

## 2023-01-11 MED ORDER — MOXIFLOXACIN HCL 0.5 % OP SOLN
OPHTHALMIC | Status: DC | PRN
  Administered 2023-01-11: .2 mL via OPHTHALMIC

## 2023-01-11 MED ORDER — SIGHTPATH DOSE#1 BSS IO SOLN
INTRAOCULAR | Status: DC | PRN
  Administered 2023-01-11: 15 mL via INTRAOCULAR

## 2023-01-11 MED ORDER — TETRACAINE HCL 0.5 % OP SOLN
1.0000 [drp] | OPHTHALMIC | Status: DC | PRN
  Administered 2023-01-11 (×3): 1 [drp] via OPHTHALMIC

## 2023-01-11 MED ORDER — MIDAZOLAM HCL 2 MG/2ML IJ SOLN
INTRAMUSCULAR | Status: AC
Start: 2023-01-11 — End: ?
  Filled 2023-01-11: qty 2

## 2023-01-11 SURGICAL SUPPLY — 11 items
CATARACT SUITE SIGHTPATH (MISCELLANEOUS) ×1 IMPLANT
DISSECTOR HYDRO NUCLEUS 50X22 (MISCELLANEOUS) ×1 IMPLANT
FEE CATARACT SUITE SIGHTPATH (MISCELLANEOUS) ×1 IMPLANT
GLOVE PI ULTRA LF STRL 7.5 (GLOVE) ×1 IMPLANT
GLOVE SURG POLYISOPRENE 8.5 (GLOVE) ×1 IMPLANT
GLOVE SURG SYN 8.5 PF PI BL (GLOVE) ×1 IMPLANT
LENS IOL TECNIS EYHANCE 23.0 (Intraocular Lens) IMPLANT
NDL FILTER BLUNT 18X1 1/2 (NEEDLE) ×1 IMPLANT
NEEDLE FILTER BLUNT 18X1 1/2 (NEEDLE) ×1 IMPLANT
SYR 3ML LL SCALE MARK (SYRINGE) ×1 IMPLANT
SYR 5ML LL (SYRINGE) ×1 IMPLANT

## 2023-01-11 NOTE — Transfer of Care (Signed)
 Immediate Anesthesia Transfer of Care Note  Patient: Evelyn Kirby  Procedure(s) Performed: CATARACT EXTRACTION PHACO AND INTRAOCULAR LENS PLACEMENT (IOC) RIGHT DIABETIC (Right: Eye)  Patient Location: PACU  Anesthesia Type: MAC  Level of Consciousness: awake, alert  and patient cooperative  Airway and Oxygen Therapy: Patient Spontanous Breathing and Patient connected to supplemental oxygen  Post-op Assessment: Post-op Vital signs reviewed, Patient's Cardiovascular Status Stable, Respiratory Function Stable, Patent Airway and No signs of Nausea or vomiting  Post-op Vital Signs: Reviewed and stable  Complications: No notable events documented.

## 2023-01-11 NOTE — Op Note (Signed)
 OPERATIVE NOTE  Evelyn Kirby [id number] 01/11/2023   PREOPERATIVE DIAGNOSIS:  Nuclear sclerotic cataract right eye.  H25.11   POSTOPERATIVE DIAGNOSIS:    Nuclear sclerotic cataract right eye.     PROCEDURE:  Phacoemusification with posterior chamber intraocular lens placement of the right eye   LENS:   Implant Name Type Inv. Item Serial No. Manufacturer Lot No. LRB No. Used Action  LENS IOL TECNIS EYHANCE 23.0 - [id number] Intraocular Lens LENS IOL TECNIS EYHANCE 23.0 [id number] [employer]  Right 1 Implanted       Procedure(s) with comments: CATARACT EXTRACTION PHACO AND INTRAOCULAR LENS PLACEMENT (IOC) RIGHT DIABETIC (Right) - 6.59 0:50.6  SURGEON:  Adine Novak, MD, MPH  ANESTHESIOLOGIST: Anesthesiologist: Ola Donny BROCKS, MD CRNA: Jahoo, Sonia, CRNA   ANESTHESIA:  Topical with tetracaine  drops augmented with 1% preservative-free intracameral lidocaine .  ESTIMATED BLOOD LOSS: less than 1 mL.   COMPLICATIONS:  None.   DESCRIPTION OF PROCEDURE:  The patient was identified in the holding room and transported to the operating room and placed in the supine position under the operating microscope.  The right eye was identified as the operative eye and it was prepped and draped in the usual sterile ophthalmic fashion.   A 1.0 millimeter clear-corneal paracentesis was made at the 10:30 position. 0.5 ml of preservative-free 1% lidocaine  with epinephrine  was injected into the anterior chamber.  The anterior chamber was filled with viscoelastic.  A 2.4 millimeter keratome was used to make a near-clear corneal incision at the 8:00 position.  A curvilinear capsulorrhexis was made with a cystotome and capsulorrhexis forceps.  Balanced salt  solution was used to hydrodissect and hydrodelineate the nucleus.   Phacoemulsification was then used in stop and chop fashion to remove the lens nucleus and epinucleus.  The remaining cortex was then removed using the irrigation and aspiration  handpiece. Viscoelastic was then placed into the capsular bag to distend it for lens placement.  A lens was then injected into the capsular bag.  The remaining viscoelastic was aspirated.   Wounds were hydrated with balanced salt  solution.  The anterior chamber was inflated to a physiologic pressure with balanced salt  solution.   Intracameral vigamox  0.1 mL undiluted was injected into the eye and a drop placed onto the ocular surface.  No wound leaks were noted.  The patient was taken to the recovery room in stable condition without complications of anesthesia or surgery  Adine Novak 01/11/2023, 9:35 AM

## 2023-01-11 NOTE — Anesthesia Postprocedure Evaluation (Signed)
 Anesthesia Post Note  Patient: Wanona Stare  Procedure(s) Performed: CATARACT EXTRACTION PHACO AND INTRAOCULAR LENS PLACEMENT (IOC) RIGHT DIABETIC (Right: Eye)  Patient location during evaluation: PACU Anesthesia Type: MAC Level of consciousness: awake and alert Pain management: pain level controlled Vital Signs Assessment: post-procedure vital signs reviewed and stable Respiratory status: spontaneous breathing, nonlabored ventilation, respiratory function stable and patient connected to nasal cannula oxygen Cardiovascular status: stable and blood pressure returned to baseline Postop Assessment: no apparent nausea or vomiting Anesthetic complications: no   No notable events documented.   Last Vitals:  Vitals:   01/11/23 0939 01/11/23 0941  BP:  115/60  Pulse:  83  Resp:  14  Temp: 36.8 C 36.8 C  SpO2:  96%    Last Pain:  Vitals:   01/11/23 0936  TempSrc:   PainSc: 0-No pain                 Donny JAYSON Mu

## 2023-01-11 NOTE — H&P (Signed)
 [facility]   Primary Care Physician:  Don Lauraine Collar, NP Ophthalmologist: Dr. Adine Novak  Pre-Procedure History & Physical: HPI:  Evelyn Kirby is a 78 y.o. female here for cataract surgery.   Past Medical History:  Diagnosis Date   Hypercholesteremia    Hypertension    Pre-diabetes    Sensorineural hearing loss, bilateral     Past Surgical History:  Procedure Laterality Date   ABDOMINAL HYSTERECTOMY     BREAST BIOPSY Left    neg   CATARACT EXTRACTION W/PHACO Left 12/21/2022   Procedure: CATARACT EXTRACTION PHACO AND INTRAOCULAR LENS PLACEMENT (IOC) LEFT DIABETIC  5.15  00:37.5;  Surgeon: Novak Adine Anes, MD;  Location: [facility];  Service: Ophthalmology;  Laterality: Left;   INCISION AND DRAINAGE ABSCESS N/A 12/28/2014   Procedure: INCISION AND DRAINAGE ABSCESS;  Surgeon: Carlin Pastel, MD;  Location: [facility];  Service: General;  Laterality: N/A;    Prior to Admission medications   Medication Sig Start Date End Date Taking? Authorizing Provider  amLODipine (NORVASC) 5 MG tablet Take 5 mg by mouth daily.   Yes [provider]  hydrochlorothiazide  (HYDRODIURIL ) 25 MG tablet Take 1 tablet by mouth daily. 10/05/14  Yes [provider]  losartan (COZAAR) 100 MG tablet TAKE ONE (1) TABLET BY MOUTH ONCE DAILY 09/18/20  Yes [provider]  naproxen sodium (ALEVE) 220 MG tablet Take 2 tablets by mouth daily.   Yes [provider]  rosuvastatin (CRESTOR) 10 MG tablet Take 10 mg by mouth daily.   Yes [provider]  simvastatin (ZOCOR) 40 MG tablet Take 1 tablet by mouth at bedtime. 10/05/14  Yes [provider]  fluticasone  (FLONASE ) 50 MCG/ACT nasal spray Place 2 sprays into both nostrils daily. Patient not taking: Reported on 12/11/2022 12/21/20   Van Knee, MD  metFORMIN  (GLUCOPHAGE ) 500 MG tablet Take 1 tablet by mouth daily. 10/05/14 01/06/20  [provider]    Allergies as  of 11/19/2022   (No Known Allergies)    Family History  Problem Relation Age of Onset   Heart failure Mother    Breast cancer Cousin        2 mat cousins    Social History   Socioeconomic History   Marital status: Married    Spouse name: Not on file   Number of children: Not on file   Years of education: Not on file   Highest education level: Not on file  Occupational History   Not on file  Tobacco Use   Smoking status: Former    Current packs/day: 0.00    Types: Cigarettes    Quit date: 01/10/1986    Years since quitting: 37.0   Smokeless tobacco: Never  Vaping Use   Vaping status: Never Used  Substance and Sexual Activity   Alcohol use: No   Drug use: No   Sexual activity: Not on file  Other Topics Concern   Not on file  Social History Narrative   Not on file   Social Drivers of Health   Financial Resource Strain: Not on file  Food Insecurity: Not on file  Transportation Needs: Not on file  Physical Activity: Not on file  Stress: Not on file  Social Connections: Not on file  Intimate Partner Violence: Not on file    Review of Systems: See HPI, otherwise negative ROS  Physical Exam: BP (!) 154/63   Pulse 79   Temp 98.6 F (37 C) (Temporal)   Resp  13   Ht 5' 1.5 (1.562 m)   Wt 59.7 kg   SpO2 95%   BMI 24.46 kg/m  General:   Alert, cooperative in NAD Head:  Normocephalic and atraumatic. Respiratory:  Normal work of breathing. Cardiovascular:  RRR  Impression/Plan: Evelyn Kirby is here for cataract surgery.  Risks, benefits, limitations, and alternatives regarding cataract surgery have been reviewed with the patient.  Questions have been answered.  All parties agreeable.   Adine Novak, MD  01/11/2023, 9:08 AM

## 2023-01-12 ENCOUNTER — Encounter: Payer: Self-pay | Admitting: Ophthalmology

## 2023-02-02 DIAGNOSIS — Z961 Presence of intraocular lens: Secondary | ICD-10-CM | POA: Diagnosis not present

## 2023-05-24 DIAGNOSIS — E119 Type 2 diabetes mellitus without complications: Secondary | ICD-10-CM | POA: Diagnosis not present

## 2023-05-24 DIAGNOSIS — Z872 Personal history of diseases of the skin and subcutaneous tissue: Secondary | ICD-10-CM | POA: Diagnosis not present

## 2023-05-24 DIAGNOSIS — L900 Lichen sclerosus et atrophicus: Secondary | ICD-10-CM | POA: Diagnosis not present

## 2023-05-24 DIAGNOSIS — L308 Other specified dermatitis: Secondary | ICD-10-CM | POA: Diagnosis not present

## 2023-05-24 DIAGNOSIS — L578 Other skin changes due to chronic exposure to nonionizing radiation: Secondary | ICD-10-CM | POA: Diagnosis not present

## 2023-05-24 DIAGNOSIS — Z86018 Personal history of other benign neoplasm: Secondary | ICD-10-CM | POA: Diagnosis not present

## 2023-05-24 DIAGNOSIS — I509 Heart failure, unspecified: Secondary | ICD-10-CM | POA: Diagnosis not present

## 2023-06-10 ENCOUNTER — Other Ambulatory Visit: Payer: Self-pay | Admitting: Nurse Practitioner

## 2023-06-10 DIAGNOSIS — N189 Chronic kidney disease, unspecified: Secondary | ICD-10-CM | POA: Diagnosis not present

## 2023-06-10 DIAGNOSIS — I129 Hypertensive chronic kidney disease with stage 1 through stage 4 chronic kidney disease, or unspecified chronic kidney disease: Secondary | ICD-10-CM | POA: Diagnosis not present

## 2023-06-10 DIAGNOSIS — Z79899 Other long term (current) drug therapy: Secondary | ICD-10-CM | POA: Diagnosis not present

## 2023-06-10 DIAGNOSIS — Z0000 Encounter for general adult medical examination without abnormal findings: Secondary | ICD-10-CM | POA: Diagnosis not present

## 2023-06-10 DIAGNOSIS — Z1231 Encounter for screening mammogram for malignant neoplasm of breast: Secondary | ICD-10-CM

## 2023-06-10 DIAGNOSIS — Z1331 Encounter for screening for depression: Secondary | ICD-10-CM | POA: Diagnosis not present

## 2023-06-10 DIAGNOSIS — E785 Hyperlipidemia, unspecified: Secondary | ICD-10-CM | POA: Diagnosis not present

## 2023-06-10 DIAGNOSIS — N183 Chronic kidney disease, stage 3 unspecified: Secondary | ICD-10-CM | POA: Diagnosis not present

## 2023-06-10 DIAGNOSIS — I10 Essential (primary) hypertension: Secondary | ICD-10-CM | POA: Diagnosis not present

## 2023-06-10 DIAGNOSIS — E1122 Type 2 diabetes mellitus with diabetic chronic kidney disease: Secondary | ICD-10-CM | POA: Diagnosis not present

## 2023-06-21 DIAGNOSIS — M810 Age-related osteoporosis without current pathological fracture: Secondary | ICD-10-CM | POA: Diagnosis not present

## 2023-06-29 DIAGNOSIS — M810 Age-related osteoporosis without current pathological fracture: Secondary | ICD-10-CM | POA: Diagnosis not present

## 2023-07-22 ENCOUNTER — Ambulatory Visit
Admission: RE | Admit: 2023-07-22 | Discharge: 2023-07-22 | Disposition: A | Discharge status: Home / Self Care | Source: Ambulatory Visit | Attending: Nurse Practitioner | Admitting: Nurse Practitioner

## 2023-07-22 DIAGNOSIS — Z1231 Encounter for screening mammogram for malignant neoplasm of breast: Secondary | ICD-10-CM | POA: Diagnosis not present

## 2023-09-11 IMAGING — MG MM DIGITAL SCREENING BILAT W/ TOMO AND CAD
8 series · 9 of 24 positions shown · non-contrast
Comparison: Previous exam(s).

CLINICAL DATA: Screening.

EXAM:
DIGITAL SCREENING BILATERAL MAMMOGRAM WITH TOMOSYNTHESIS AND CAD
TECHNIQUE: Bilateral screening digital craniocaudal and mediolateral oblique
mammograms were obtained. Bilateral screening digital breast
tomosynthesis was performed. The images were evaluated with
computer-aided detection.

[L MLO synth-2D]
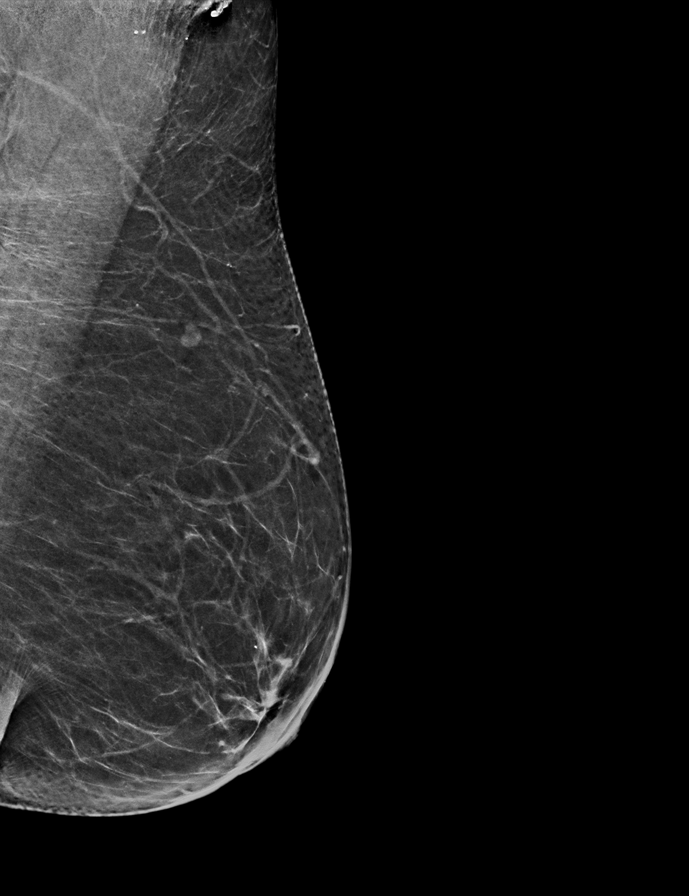

[L CC synth-2D]
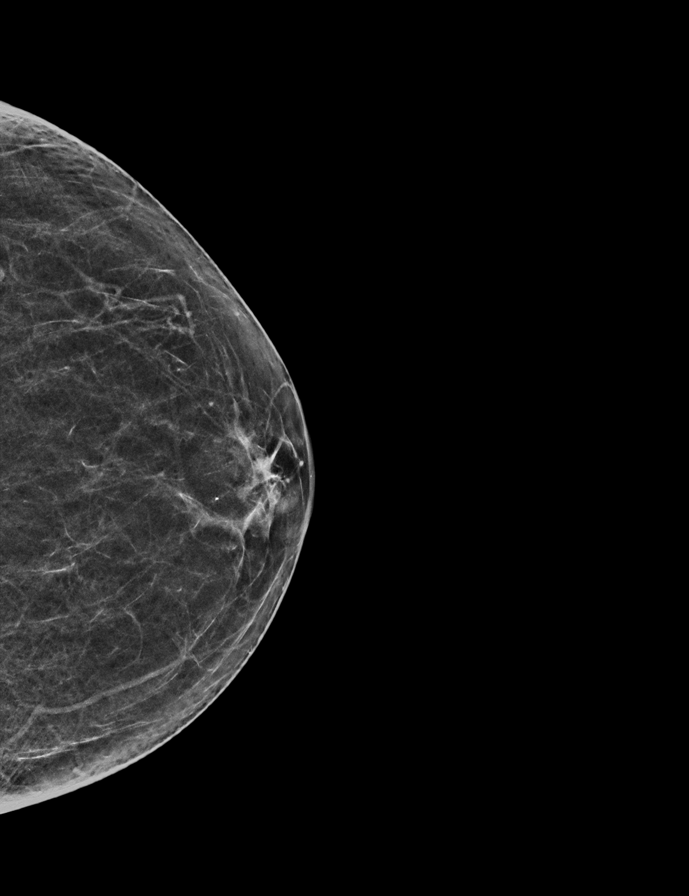

[R MLO synth-2D]
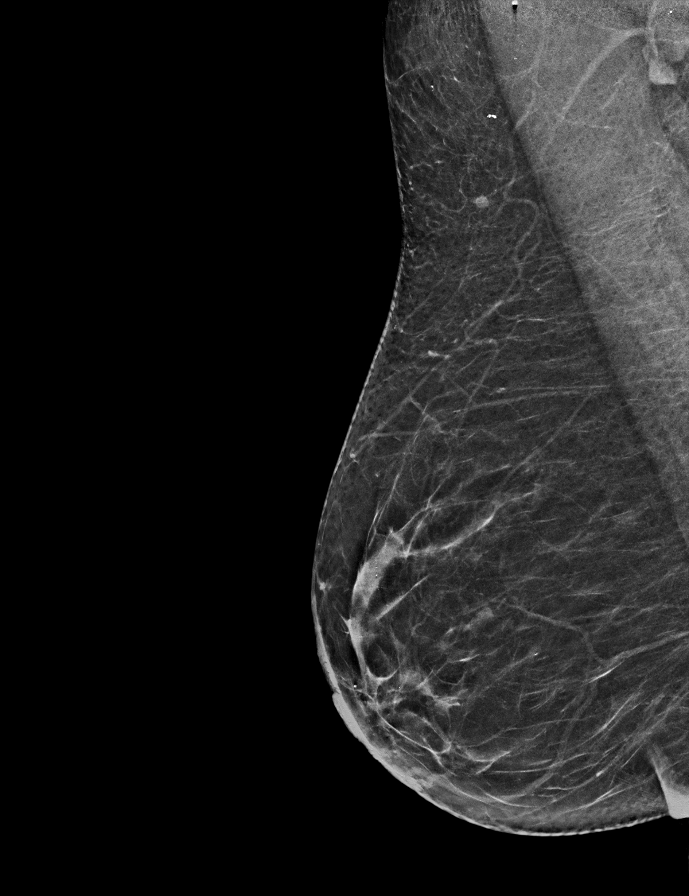

[R CC synth-2D]
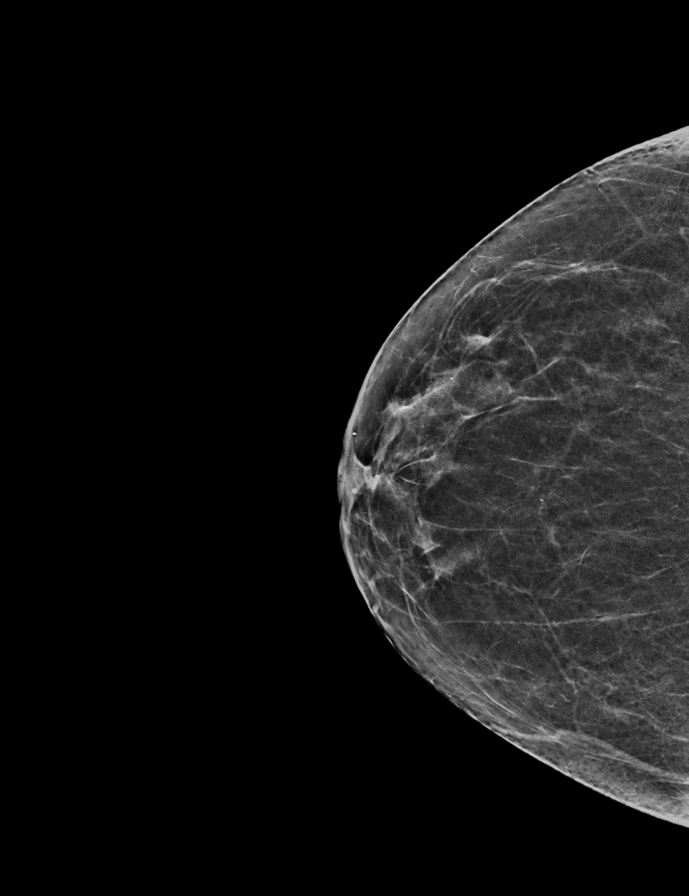

[L MLO tomo · 2 of 59 frames shown]
[frame 20/59]
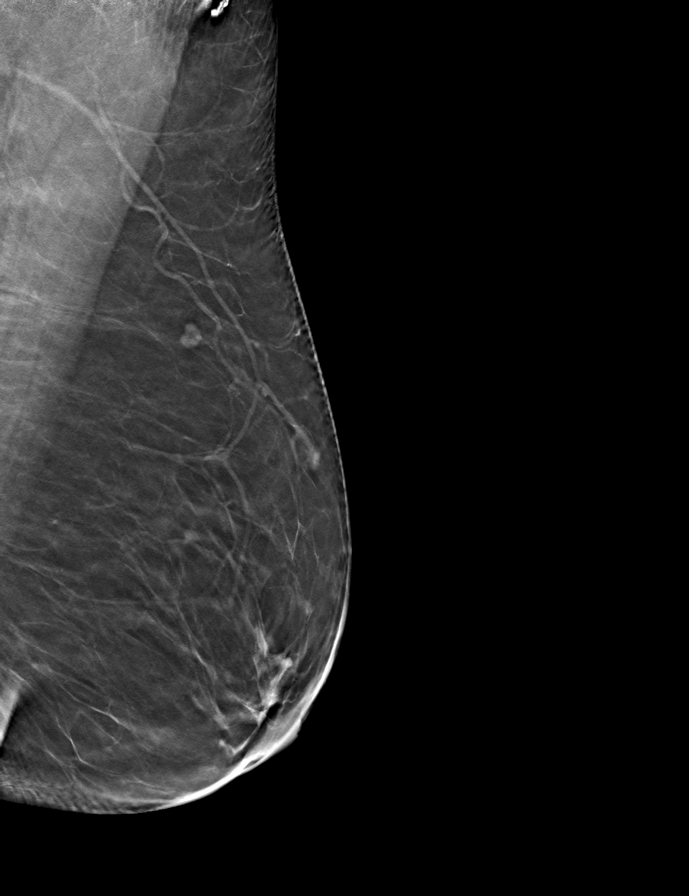
[frame 30/59]
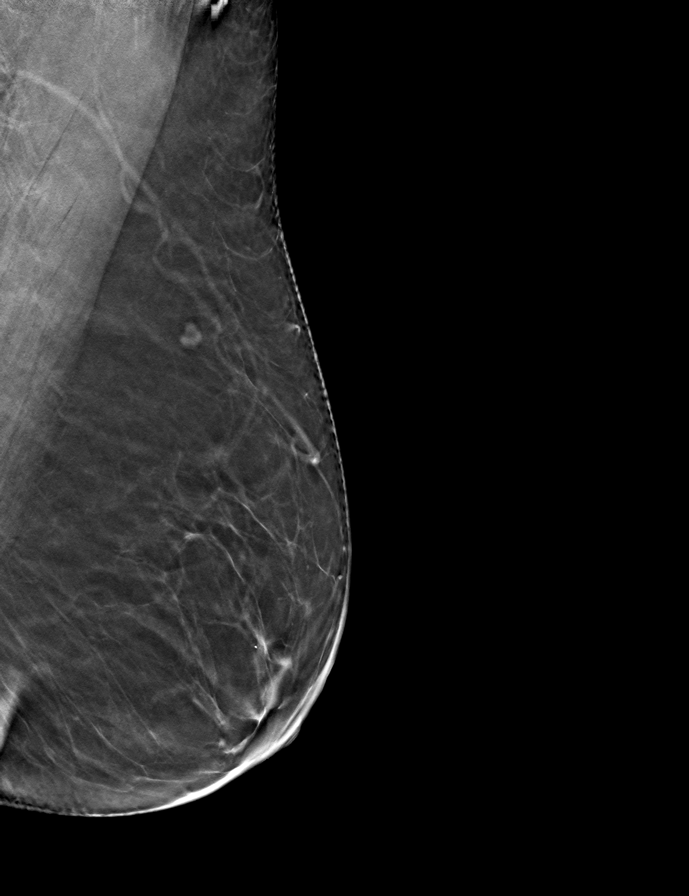

[R CC tomo · tomo slice 25/49.0]
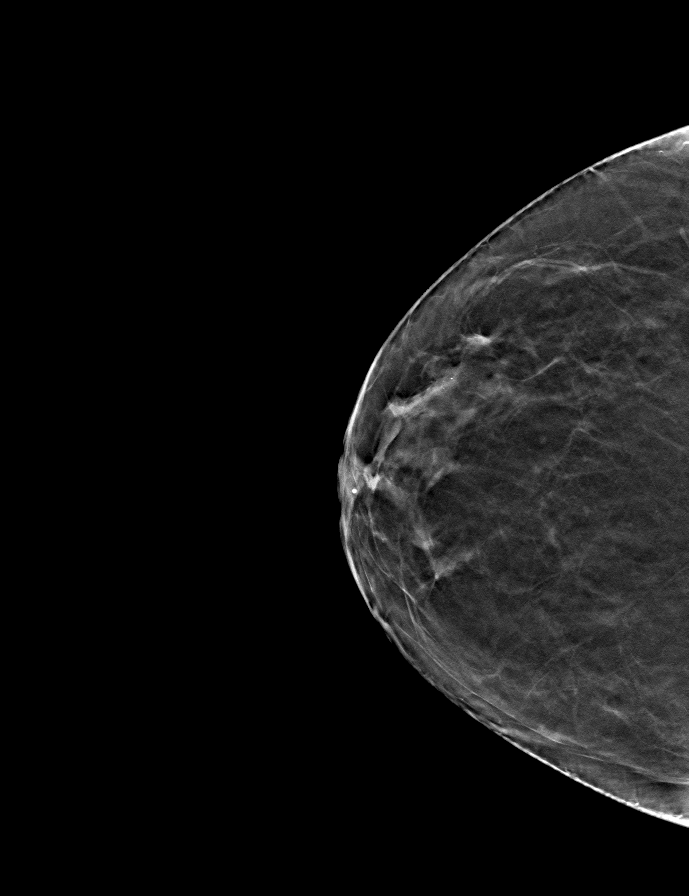

[L CC tomo · tomo slice 25/50.0]
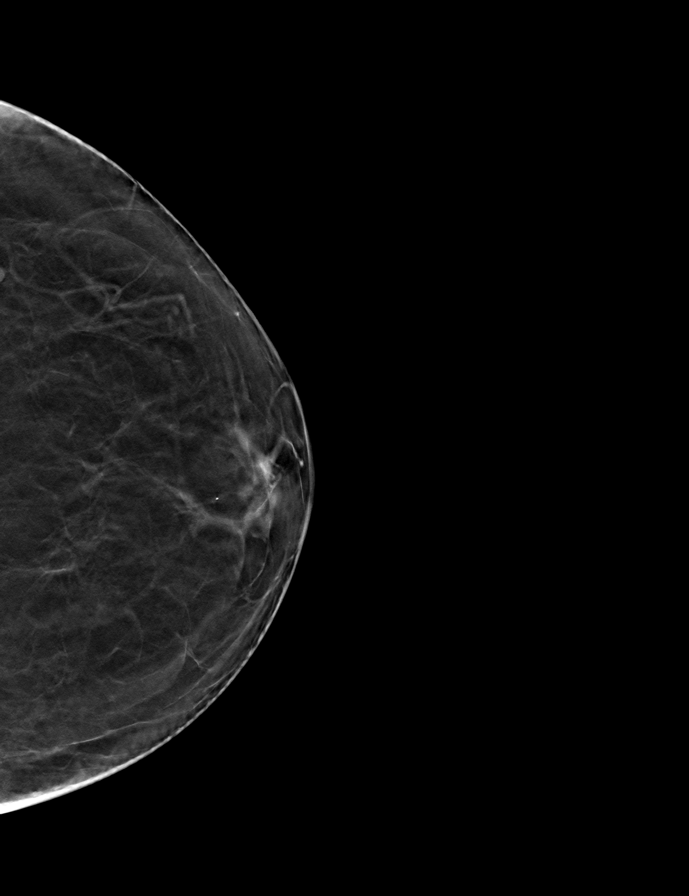

[R MLO tomo · tomo slice 29/58.0]
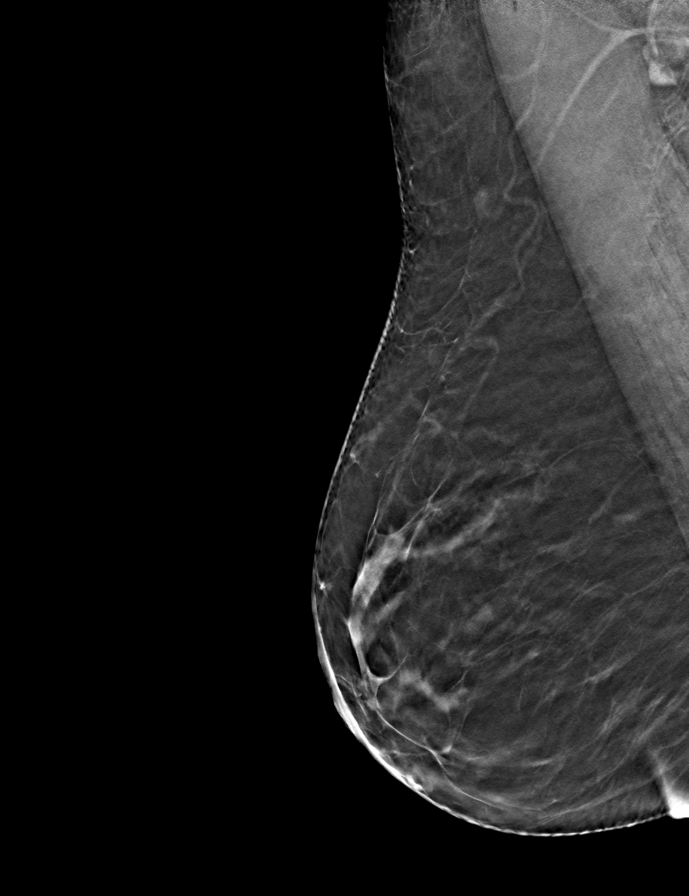

[9 of 24 positions shown; findings below may reference images not displayed]

ACR Breast Density Category b: There are scattered areas of
fibroglandular density.
FINDINGS: There are no findings suspicious for malignancy.
IMPRESSION: No mammographic evidence of malignancy. A result letter of this
screening mammogram will be mailed directly to the patient.

RECOMMENDATION:
Screening mammogram in one year. (Code:51-O-LD2)

BI-RADS CATEGORY  1: Negative.
